# Patient Record
Sex: Female | Born: 1965 | Race: White | Hispanic: No | Marital: Married | Smoking: Never smoker
Health system: Southern US, Community
[De-identification: ages and names within clinical notes are randomized; demographics above are authoritative.]

## PROBLEM LIST (undated history)

## (undated) HISTORY — PX: FOOT SURGERY: SHX648

## (undated) HISTORY — PX: AUGMENTATION MAMMAPLASTY: SUR837

---

## 2006-02-11 DIAGNOSIS — D649 Anemia, unspecified: Secondary | ICD-10-CM | POA: Insufficient documentation

## 2006-02-11 DIAGNOSIS — G43909 Migraine, unspecified, not intractable, without status migrainosus: Secondary | ICD-10-CM | POA: Insufficient documentation

## 2006-07-23 DIAGNOSIS — Z8041 Family history of malignant neoplasm of ovary: Secondary | ICD-10-CM | POA: Insufficient documentation

## 2006-07-23 DIAGNOSIS — Z803 Family history of malignant neoplasm of breast: Secondary | ICD-10-CM | POA: Insufficient documentation

## 2008-05-17 DIAGNOSIS — H5210 Myopia, unspecified eye: Secondary | ICD-10-CM | POA: Insufficient documentation

## 2009-11-25 DIAGNOSIS — G8929 Other chronic pain: Secondary | ICD-10-CM | POA: Insufficient documentation

## 2009-11-28 DIAGNOSIS — S92909A Unspecified fracture of unspecified foot, initial encounter for closed fracture: Secondary | ICD-10-CM | POA: Insufficient documentation

## 2009-11-28 DIAGNOSIS — Z9889 Other specified postprocedural states: Secondary | ICD-10-CM | POA: Insufficient documentation

## 2009-11-28 DIAGNOSIS — J342 Deviated nasal septum: Secondary | ICD-10-CM | POA: Insufficient documentation

## 2009-11-28 DIAGNOSIS — M779 Enthesopathy, unspecified: Secondary | ICD-10-CM | POA: Insufficient documentation

## 2010-03-12 DIAGNOSIS — W5501XA Bitten by cat, initial encounter: Secondary | ICD-10-CM | POA: Insufficient documentation

## 2010-03-12 DIAGNOSIS — L853 Xerosis cutis: Secondary | ICD-10-CM | POA: Insufficient documentation

## 2010-04-25 DIAGNOSIS — F988 Other specified behavioral and emotional disorders with onset usually occurring in childhood and adolescence: Secondary | ICD-10-CM | POA: Insufficient documentation

## 2010-04-25 DIAGNOSIS — Z0000 Encounter for general adult medical examination without abnormal findings: Secondary | ICD-10-CM | POA: Insufficient documentation

## 2010-04-25 DIAGNOSIS — Z8619 Personal history of other infectious and parasitic diseases: Secondary | ICD-10-CM | POA: Insufficient documentation

## 2011-01-06 DIAGNOSIS — L659 Nonscarring hair loss, unspecified: Secondary | ICD-10-CM | POA: Insufficient documentation

## 2014-09-03 DIAGNOSIS — H04123 Dry eye syndrome of bilateral lacrimal glands: Secondary | ICD-10-CM | POA: Insufficient documentation

## 2015-07-05 DIAGNOSIS — Z780 Asymptomatic menopausal state: Secondary | ICD-10-CM | POA: Insufficient documentation

## 2015-07-05 DIAGNOSIS — B349 Viral infection, unspecified: Secondary | ICD-10-CM | POA: Insufficient documentation

## 2015-10-02 DIAGNOSIS — H52223 Regular astigmatism, bilateral: Secondary | ICD-10-CM | POA: Insufficient documentation

## 2015-10-02 DIAGNOSIS — H524 Presbyopia: Secondary | ICD-10-CM | POA: Insufficient documentation

## 2016-02-02 DIAGNOSIS — R0789 Other chest pain: Secondary | ICD-10-CM | POA: Insufficient documentation

## 2016-02-02 DIAGNOSIS — N644 Mastodynia: Secondary | ICD-10-CM | POA: Insufficient documentation

## 2016-07-12 DIAGNOSIS — X030XXA Exposure to flames in controlled fire, not in building or structure, initial encounter: Secondary | ICD-10-CM | POA: Insufficient documentation

## 2017-02-23 ENCOUNTER — Emergency Department (HOSPITAL_COMMUNITY)
Admission: EM | Admit: 2017-02-23 | Discharge: 2017-02-24 | Disposition: A | Discharge status: Home / Self Care | Payer: BLUE CROSS/BLUE SHIELD | Attending: Emergency Medicine | Admitting: Emergency Medicine

## 2017-02-23 ENCOUNTER — Encounter (HOSPITAL_COMMUNITY): Payer: Self-pay

## 2017-02-23 ENCOUNTER — Ambulatory Visit (HOSPITAL_COMMUNITY)
Admission: EM | Admit: 2017-02-23 | Discharge: 2017-02-23 | Disposition: A | Discharge status: Home / Self Care | Payer: BLUE CROSS/BLUE SHIELD | Source: Home / Self Care | Attending: Family Medicine | Admitting: Family Medicine

## 2017-02-23 ENCOUNTER — Encounter (HOSPITAL_COMMUNITY): Payer: Self-pay | Admitting: Emergency Medicine

## 2017-02-23 DIAGNOSIS — J069 Acute upper respiratory infection, unspecified: Secondary | ICD-10-CM

## 2017-02-23 DIAGNOSIS — J0110 Acute frontal sinusitis, unspecified: Secondary | ICD-10-CM

## 2017-02-23 DIAGNOSIS — B9789 Other viral agents as the cause of diseases classified elsewhere: Secondary | ICD-10-CM | POA: Diagnosis not present

## 2017-02-23 DIAGNOSIS — G4489 Other headache syndrome: Secondary | ICD-10-CM | POA: Diagnosis not present

## 2017-02-23 DIAGNOSIS — R51 Headache: Secondary | ICD-10-CM | POA: Diagnosis present

## 2017-02-23 MED ORDER — SODIUM CHLORIDE 0.9 % IV BOLUS (SEPSIS)
1000.0000 mL | Freq: Once | INTRAVENOUS | Status: AC
  Administered 2017-02-24: 1000 mL via INTRAVENOUS

## 2017-02-23 MED ORDER — FLUTICASONE PROPIONATE 50 MCG/ACT NA SUSP: 1.0000 | Freq: Every day | NASAL | 0 refills | Status: DC

## 2017-02-23 MED ORDER — METOCLOPRAMIDE HCL 5 MG/ML IJ SOLN
10.0000 mg | Freq: Once | INTRAMUSCULAR | Status: AC
  Administered 2017-02-24: 10 mg via INTRAVENOUS
  Filled 2017-02-23: qty 2

## 2017-02-23 MED ORDER — DIPHENHYDRAMINE HCL 50 MG/ML IJ SOLN
25.0000 mg | Freq: Once | INTRAMUSCULAR | Status: AC
  Administered 2017-02-24: 25 mg via INTRAVENOUS
  Filled 2017-02-23: qty 1

## 2017-02-23 MED ORDER — PREDNISONE 10 MG PO TABS: 40.0000 mg | ORAL_TABLET | Freq: Every day | ORAL | 0 refills | Status: DC

## 2017-02-23 MED ORDER — KETOROLAC TROMETHAMINE 30 MG/ML IJ SOLN
15.0000 mg | Freq: Once | INTRAMUSCULAR | Status: AC
  Administered 2017-02-24: 15 mg via INTRAVENOUS
  Filled 2017-02-23: qty 1

## 2017-02-23 NOTE — ED Triage Notes (Signed)
Pt seen at u/c today for onset cough, nasal congestion, headache, mild sore throat, and nasal congestion.  Pt was prescribed Flonase and Prednisone.  Pt reported allergic to ? Steroid.  Pt got flonase and Prednisone but has not taken prednisone.  Pt is here tonight for headache across forehead, feels like pressure and pressure behind eyes.  Pt has been taking Mucinex day and nighttime with no relief. No respiratory or swallowing difficulties.

## 2017-02-23 NOTE — ED Provider Notes (Signed)
[facility]    CSN: [id number] Arrival date & time: 02/23/17  1322     History   Chief Complaint Chief Complaint  Patient presents with  . URI    HPI Carol Morrison is a 51 y.o. female.   51 year-old female, presenting today due to cold symptoms. Patient states that she has had nasal congestion, mild sore throat, sinus pressure, headache and mild cough that started yesterday. She has been taking OTC meds at home with much relief. Husband was sick with similar symptoms last week. She denies fever, chills, neck pain or stiffness, nausea, vomiting   The history is provided by the patient.  URI  Presenting symptoms: congestion, cough, ear pain, fatigue, rhinorrhea and sore throat   Presenting symptoms: no fever   Severity:  Moderate Onset quality:  Gradual Duration:  1 day Timing:  Constant Progression:  Unchanged Chronicity:  New Relieved by:  Nothing Worsened by:  Nothing Ineffective treatments:  OTC medications Associated symptoms: headaches, myalgias and sinus pain   Associated symptoms: no arthralgias, no neck pain, no sneezing, no swollen glands and no wheezing   Risk factors: sick contacts   Risk factors: not elderly, no chronic cardiac disease, no chronic kidney disease, no chronic respiratory disease, no diabetes mellitus, no immunosuppression, no recent illness and no recent travel     History reviewed. No pertinent past medical history.  There are no active problems to display for this patient.   Past Surgical History:  Procedure Laterality Date  . FOOT SURGERY      OB History    No data available       Home Medications    Prior to Admission medications   Medication Sig Start Date End Date Taking? Authorizing Provider  fluticasone (FLONASE) 50 MCG/ACT nasal spray Place 1 spray into both nostrils daily. 02/23/17 03/05/17  Blue, Marylene Landlivia C, PA-C    Family History No family history on file.  Social History Social History  Substance  Use Topics  . Smoking status: Not on file  . Smokeless tobacco: Not on file  . Alcohol use Not on file     Allergies   Patient has no known allergies.   Review of Systems Review of Systems  Constitutional: Positive for fatigue. Negative for chills and fever.  HENT: Positive for congestion, ear pain, rhinorrhea, sinus pain and sore throat. Negative for sneezing.   Eyes: Negative for pain and visual disturbance.  Respiratory: Positive for cough. Negative for shortness of breath and wheezing.   Cardiovascular: Negative for chest pain and palpitations.  Gastrointestinal: Negative for abdominal pain and vomiting.  Genitourinary: Negative for dysuria and hematuria.  Musculoskeletal: Positive for myalgias. Negative for arthralgias, back pain and neck pain.  Skin: Negative for color change and rash.  Neurological: Positive for headaches. Negative for seizures and syncope.  All other systems reviewed and are negative.    Physical Exam Triage Vital Signs ED Triage Vitals  Enc Vitals Group     BP 02/23/17 1345 132/74     Pulse Rate 02/23/17 1345 (!) 113     Resp 02/23/17 1345 16     Temp 02/23/17 1345 99.3 F (37.4 C)     Temp Source 02/23/17 1345 Oral     SpO2 02/23/17 1345 100 %     Weight --      Height --      Head Circumference --      Peak Flow --      Pain Score  02/23/17 1346 7     Pain Loc --      Pain Edu? --      Excl. in GC? --    No data found.   Updated Vital Signs BP 132/74 (BP Location: Left Arm)   Pulse (!) 113   Temp 99.3 F (37.4 C) (Oral)   Resp 16   SpO2 100%   Visual Acuity Right Eye Distance:   Left Eye Distance:   Bilateral Distance:    Right Eye Near:   Left Eye Near:    Bilateral Near:     Physical Exam  Constitutional: She appears well-developed and well-nourished. No distress.  HENT:  Head: Normocephalic and atraumatic.  Right Ear: Hearing, tympanic membrane, external ear and ear canal normal.  Left Ear: Hearing, tympanic  membrane, external ear and ear canal normal.  Nose: Nose normal.  Mouth/Throat: Uvula is midline and oropharynx is clear and moist. No oropharyngeal exudate, posterior oropharyngeal edema, posterior oropharyngeal erythema or tonsillar abscesses.  Eyes: Conjunctivae are normal.  Neck: Neck supple. No Brudzinski's sign and no Kernig's sign noted.  Cardiovascular: Normal rate and regular rhythm.   No murmur heard. Pulmonary/Chest: Effort normal and breath sounds normal. No respiratory distress. She has no decreased breath sounds. She has no wheezes. She has no rhonchi. She has no rales.  Abdominal: Soft. There is no tenderness.  Musculoskeletal: She exhibits no edema.  Neurological: She is alert.  Skin: Skin is warm and dry.  Psychiatric: She has a normal mood and affect.  Nursing note and vitals reviewed.    UC Treatments / Results  Labs (all labs ordered are listed, but only abnormal results are displayed) Labs Reviewed - No data to display  EKG  EKG Interpretation None       Radiology No results found.  Procedures Procedures (including critical care time)  Medications Ordered in UC Medications - No data to display   Initial Impression / Assessment and Plan / UC Course  I have reviewed the triage vital signs and the nursing notes.  Pertinent labs & imaging results that were available during my care of the patient were reviewed by me and considered in my medical decision making (see chart for details).     URI symptoms. Patient requesting flu swab. Explained to the patient that we do not flu testing here but she does not seem have influenza symptoms. She is well appearing, afebrile and has mostly URI symptoms. Recommended flonase and prednisone. After leaving the room, patient expressed to nursing that she has had adverse reaction to prednisone in the past. Offered decadron, patient also refused this. Recommended OTC meds   Final Clinical Impressions(s) / UC Diagnoses    Final diagnoses:  Viral URI with cough    New Prescriptions Discharge Medication List as of 02/23/2017  2:06 PM    START taking these medications   Details  fluticasone (FLONASE) 50 MCG/ACT nasal spray Place 1 spray into both nostrils daily., Starting Tue 02/23/2017, Until Fri 03/05/2017, Normal    predniSONE (DELTASONE) 10 MG tablet Take 4 tablets (40 mg total) by mouth daily., Starting Tue 02/23/2017, Until Sun 02/28/2017, Normal         Controlled Substance Prescriptions [place] Controlled Substance Registry consulted? Not Applicable   Alecia Lemming, [place] 02/23/17 1454

## 2017-02-23 NOTE — ED Triage Notes (Signed)
Pt c/o cold symptoms. Head pain, nasal congestion, cough.

## 2017-02-24 NOTE — ED Provider Notes (Signed)
[facility] EMERGENCY DEPARTMENT Provider Note   CSN: [id number] Arrival date & time: 02/23/17  2101     History   Chief Complaint Chief Complaint  Patient presents with  . Headache    HPI Carol Morrison is a 51 y.o. female.  The history is provided by the patient and the spouse.  Headache   This is a new problem. The current episode started yesterday. The problem occurs constantly. The problem has been gradually worsening. The pain is located in the frontal region. The pain is moderate. The pain does not radiate. Associated symptoms include a fever and nausea. Pertinent negatives include no vomiting.  pt reports onset of cough/congestion/sore throat/ facial pain/fever yesterday.  She then developed onset of frontal HA No vomiting No focal weakness She reports HA is worsening and she is unable to sleep Seen at urgent care, given flonase/prednisone but no improvement    PMH -none Soc hx -no travel Past Surgical History:  Procedure Laterality Date  . FOOT SURGERY      OB History    No data available       Home Medications    Prior to Admission medications   Medication Sig Start Date End Date Taking? Authorizing Provider  fluticasone (FLONASE) 50 MCG/ACT nasal spray Place 1 spray into both nostrils daily. 02/23/17 03/05/17  Alecia Lemming, PA-C    Family History History reviewed. No pertinent family history.  Social History Social History  Substance Use Topics  . Smoking status: Never Smoker  . Smokeless tobacco: Never Used  . Alcohol use No     Allergies   Patient has no known allergies.   Review of Systems Review of Systems  Constitutional: Positive for fever.  HENT: Positive for sore throat.   Respiratory: Positive for cough.   Gastrointestinal: Positive for nausea. Negative for vomiting.  Neurological: Positive for headaches. Negative for weakness.  All other systems reviewed and are negative.    Physical Exam Updated  Vital Signs BP 107/74 (BP Location: Right Arm)   Pulse 91   Temp 98.4 F (36.9 C) (Oral)   Resp 16   Ht 1.6 m (5\' 3" )   Wt 47.6 kg (105 lb)   SpO2 99%   BMI 18.60 kg/m   Physical Exam CONSTITUTIONAL: Well developed/well nourished HEAD: Normocephalic/atraumatic, no swelling/edema EYES: EOMI/PERRL, no nystagmus, no ptosis ENMT: Mucous membranes moist, uvula midline, no erythema/exudates, tenderness to maxillary sinuses NECK: supple no meningeal signs, no bruits SPINE/BACK:entire spine nontender CV: S1/S2 noted, no murmurs/rubs/gallops noted LUNGS: Lungs are clear to auscultation bilaterally, no apparent distress ABDOMEN: soft, nontender, no rebound or guarding GU:no cva tenderness NEURO:Awake/alert, face symmetric, no arm or leg drift is noted Equal 5/5 strength with shoulder abduction, elbow flex/extension, wrist flex/extension in upper extremities and equal hand grips bilaterally Equal 5/5 strength with hip flexion,knee flex/extension, foot dorsi/plantar flexion Cranial nerves 3/4/5/6/11/02/08/11/12 tested and intact Gait normal without ataxia No past pointing Sensation to light touch intact in all extremities EXTREMITIES: pulses normal, full ROM SKIN: warm, color normal PSYCH: no abnormalities of mood noted, alert and oriented to situation    ED Treatments / Results  Labs (all labs ordered are listed, but only abnormal results are displayed) Labs Reviewed - No data to display  EKG  EKG Interpretation None       Radiology No results found.  Procedures Procedures (including critical care time)  Medications Ordered in ED Medications  metoCLOPramide (REGLAN) injection 10 mg (10 mg Intravenous Given 02/24/17  0002)  diphenhydrAMINE (BENADRYL) injection 25 mg (25 mg Intravenous Given 02/24/17 0002)  ketorolac (TORADOL) 30 MG/ML injection 15 mg (15 mg Intravenous Given 02/24/17 0002)  sodium chloride 0.9 % bolus 1,000 mL (1,000 mLs Intravenous New Bag/Given 02/24/17  0002)     Initial Impression / Assessment and Plan / ED Course  I have reviewed the triage vital signs and the nursing notes.   Probable viral sinusitis that has triggered frontal HA Pt improved No signs of meningitis or other acute neuro emergency D/c home   Final Clinical Impressions(s) / ED Diagnoses   Final diagnoses:  Acute non-recurrent frontal sinusitis  Other headache syndrome    New Prescriptions New Prescriptions   No medications on file     Zadie RhineWickline, Effie Janoski, MD 02/24/17 [phone]

## 2017-02-24 NOTE — ED Notes (Signed)
Patient left at this time with all belongings. 

## 2017-12-04 ENCOUNTER — Ambulatory Visit (INDEPENDENT_AMBULATORY_CARE_PROVIDER_SITE_OTHER): Payer: BLUE CROSS/BLUE SHIELD

## 2017-12-04 ENCOUNTER — Ambulatory Visit (INDEPENDENT_AMBULATORY_CARE_PROVIDER_SITE_OTHER): Payer: BLUE CROSS/BLUE SHIELD | Admitting: Podiatry

## 2017-12-04 ENCOUNTER — Other Ambulatory Visit: Payer: Self-pay | Admitting: Podiatry

## 2017-12-04 DIAGNOSIS — M216X9 Other acquired deformities of unspecified foot: Secondary | ICD-10-CM | POA: Diagnosis not present

## 2017-12-04 DIAGNOSIS — M778 Other enthesopathies, not elsewhere classified: Secondary | ICD-10-CM

## 2017-12-04 DIAGNOSIS — M722 Plantar fascial fibromatosis: Secondary | ICD-10-CM

## 2017-12-04 DIAGNOSIS — M779 Enthesopathy, unspecified: Secondary | ICD-10-CM

## 2017-12-07 NOTE — Progress Notes (Signed)
   Subjective: 52 year old female presenting today as a new patient with a chief complaint of bilateral foot pain that began four years ago. She states the pain is located in the arches and heels and began after a 5 year walking event. She states the pain started worsening six months ago. She has not done anything for treatment. Walking and standing for long periods of time increases the pain. Patient is here for further evaluation and treatment.   No past medical history on file.   Objective: Physical Exam General: The patient is alert and oriented x3 in no acute distress.  Dermatology: Skin is warm, dry and supple bilateral lower extremities. Negative for open lesions or macerations bilateral.   Vascular: Dorsalis Pedis and Posterior Tibial pulses palpable bilateral.  Capillary fill time is immediate to all digits.  Neurological: Epicritic and protective threshold intact bilateral.   Musculoskeletal: Tenderness to palpation to the plantar aspect of the bilateral heels along the plantar fascia. All other joints range of motion within normal limits bilateral. Strength 5/5 in all groups bilateral.   Radiographic exam: Increased calcaneus inclination angle with increased metatarsal declination angle consistent with cavus foot type.   Assessment: 1. plantar fasciitis bilateral feet 2. Generalized foot pain bilateral 3. Cavus foot type bilateral   Plan of Care:  1. Patient evaluated. Xrays reviewed.   2. Injection of 0.5cc Celestone soluspan injected into the bilateral heels.  3. Declined oral NSAIDs.  4. Plantar fascial braces dispensed bilaterally.  5. Appointment with Raiford Nobleick for custom molded orthotics.  6. Return to clinic as needed.    Felecia ShellingBrent M. Dontay Harm, DPM [facility]  Dr. Felecia ShellingBrent M. Herta Hink, DPM    [address]                                   [place], [place] [address]                Office [phone]  Fax [phone]

## 2017-12-09 ENCOUNTER — Ambulatory Visit (INDEPENDENT_AMBULATORY_CARE_PROVIDER_SITE_OTHER): Payer: BLUE CROSS/BLUE SHIELD | Admitting: Orthotics

## 2017-12-09 ENCOUNTER — Other Ambulatory Visit: Payer: BLUE CROSS/BLUE SHIELD | Admitting: Orthotics

## 2017-12-09 DIAGNOSIS — M778 Other enthesopathies, not elsewhere classified: Secondary | ICD-10-CM

## 2017-12-09 DIAGNOSIS — M779 Enthesopathy, unspecified: Secondary | ICD-10-CM

## 2017-12-09 DIAGNOSIS — M216X9 Other acquired deformities of unspecified foot: Secondary | ICD-10-CM

## 2017-12-09 DIAGNOSIS — M722 Plantar fascial fibromatosis: Secondary | ICD-10-CM | POA: Diagnosis not present

## 2017-12-09 NOTE — Progress Notes (Signed)
Patient came into today for casting bilateral f/o to address plantar fasciitis.   Patient foot type is pes cavus. Patient reports history of foot pain involving plantar aponeurosis.  Goal is to provide longitudinal arch support and correct any RF instability due to heel eversion/inversion.  Ultimate goal is to relieve tension at pf insertion calcaneal tuberosity.  Plan on semi-rigid device addressing heel stability and relieving PF tension.     Patient also wants a dress f/o...both were ordered today.

## 2017-12-20 ENCOUNTER — Other Ambulatory Visit: Payer: BLUE CROSS/BLUE SHIELD | Admitting: Orthotics

## 2017-12-30 ENCOUNTER — Ambulatory Visit (INDEPENDENT_AMBULATORY_CARE_PROVIDER_SITE_OTHER): Payer: BLUE CROSS/BLUE SHIELD | Admitting: Orthotics

## 2017-12-30 DIAGNOSIS — M722 Plantar fascial fibromatosis: Secondary | ICD-10-CM

## 2017-12-30 DIAGNOSIS — M216X9 Other acquired deformities of unspecified foot: Secondary | ICD-10-CM

## 2017-12-30 DIAGNOSIS — M779 Enthesopathy, unspecified: Secondary | ICD-10-CM

## 2017-12-30 DIAGNOSIS — M778 Other enthesopathies, not elsewhere classified: Secondary | ICD-10-CM

## 2017-12-30 NOTE — Progress Notes (Signed)
Patient came in today to pick up custom made foot orthotics (2 pair).  The goals were accomplished and the patient reported no dissatisfaction with said orthotics.  Patient was advised of breakin period and how to report any issues.

## 2019-02-20 ENCOUNTER — Other Ambulatory Visit: Payer: Self-pay

## 2019-02-20 ENCOUNTER — Ambulatory Visit (INDEPENDENT_AMBULATORY_CARE_PROVIDER_SITE_OTHER): Payer: BC Managed Care – PPO | Admitting: Podiatry

## 2019-02-20 DIAGNOSIS — M216X9 Other acquired deformities of unspecified foot: Secondary | ICD-10-CM | POA: Diagnosis not present

## 2019-02-20 DIAGNOSIS — M778 Other enthesopathies, not elsewhere classified: Secondary | ICD-10-CM | POA: Diagnosis not present

## 2019-02-20 DIAGNOSIS — M722 Plantar fascial fibromatosis: Secondary | ICD-10-CM | POA: Diagnosis not present

## 2019-02-22 ENCOUNTER — Other Ambulatory Visit: Payer: Self-pay | Admitting: Family Medicine

## 2019-02-22 DIAGNOSIS — R928 Other abnormal and inconclusive findings on diagnostic imaging of breast: Secondary | ICD-10-CM

## 2019-02-22 NOTE — Progress Notes (Signed)
   Subjective: 53 year old female presenting today for follow up evaluation of bilateral foot pain. She reports continued pain. She has been using the fascial braces, custom orthotics and states the injections have all helped to ease her symptoms. She is interested in other treatment options at this time. Patient is here for further evaluation and treatment.   No past medical history on file.   Objective: Physical Exam General: The patient is alert and oriented x3 in no acute distress.  Dermatology: Skin is warm, dry and supple bilateral lower extremities. Negative for open lesions or macerations bilateral.   Vascular: Dorsalis Pedis and Posterior Tibial pulses palpable bilateral.  Capillary fill time is immediate to all digits.  Neurological: Epicritic and protective threshold intact bilateral.   Musculoskeletal: Tenderness to palpation to the plantar aspect of the bilateral heels along the plantar fascia. All other joints range of motion within normal limits bilateral. Strength 5/5 in all groups bilateral.    Assessment: 1. plantar fasciitis bilateral feet - chronic  2. Generalized foot pain bilateral 3. Cavus foot type bilateral   Plan of Care:  1. Patient evaluated.  2. Night splint dispensed.  3. Appointment with Liliane Channel, Pedorthist, for custom molded orthotics.  4. Continue using custom orthotics she already has.  5. Recommended EPF surgery bilaterally. Patient wants to think about it.  6. Return to clinic as needed when ready for surgical consult.     Edrick Kins, DPM [facility]  Dr. Edrick Kins, DPM    [address] [place], [place] [address]                Office [phone]  Fax [phone]

## 2019-08-05 ENCOUNTER — Ambulatory Visit: Payer: BLUE CROSS/BLUE SHIELD

## 2019-08-23 ENCOUNTER — Telehealth: Payer: Self-pay | Admitting: Podiatry

## 2019-08-23 NOTE — Telephone Encounter (Signed)
Pt left message stating she needed and appt.  I returned call and left message for pt to call back to schedule an appt.

## 2019-09-04 ENCOUNTER — Ambulatory Visit (INDEPENDENT_AMBULATORY_CARE_PROVIDER_SITE_OTHER): Payer: BC Managed Care – PPO | Admitting: Orthotics

## 2019-09-04 ENCOUNTER — Other Ambulatory Visit: Payer: Self-pay

## 2019-09-04 DIAGNOSIS — M722 Plantar fascial fibromatosis: Secondary | ICD-10-CM | POA: Diagnosis not present

## 2019-09-04 NOTE — Progress Notes (Signed)
Cut sulcus f/o down to make more narrow to fit n dress shoes; also did repeat 2019 pair.

## 2019-09-15 ENCOUNTER — Other Ambulatory Visit: Payer: Self-pay | Admitting: Obstetrics and Gynecology

## 2019-09-15 DIAGNOSIS — N6314 Unspecified lump in the right breast, lower inner quadrant: Secondary | ICD-10-CM

## 2019-09-22 ENCOUNTER — Ambulatory Visit
Admission: RE | Admit: 2019-09-22 | Discharge: 2019-09-22 | Disposition: A | Discharge status: Home / Self Care | Payer: BC Managed Care – PPO | Source: Ambulatory Visit | Attending: Obstetrics and Gynecology | Admitting: Obstetrics and Gynecology

## 2019-09-22 ENCOUNTER — Other Ambulatory Visit: Payer: Self-pay

## 2019-09-22 DIAGNOSIS — N6314 Unspecified lump in the right breast, lower inner quadrant: Secondary | ICD-10-CM

## 2019-11-19 ENCOUNTER — Encounter (HOSPITAL_COMMUNITY): Payer: Self-pay | Admitting: Emergency Medicine

## 2019-11-19 ENCOUNTER — Other Ambulatory Visit: Payer: Self-pay

## 2019-11-19 ENCOUNTER — Ambulatory Visit (HOSPITAL_COMMUNITY)
Admission: EM | Admit: 2019-11-19 | Discharge: 2019-11-19 | Disposition: A | Discharge status: Home / Self Care | Payer: BC Managed Care – PPO

## 2019-11-19 DIAGNOSIS — R2231 Localized swelling, mass and lump, right upper limb: Secondary | ICD-10-CM

## 2019-11-19 DIAGNOSIS — M79601 Pain in right arm: Secondary | ICD-10-CM | POA: Diagnosis not present

## 2019-11-19 NOTE — Discharge Instructions (Addendum)
I do think you could try ice and/or antiinflammatory to try to help with discomfort related to the region of swelling/ mass to your right inner arm.  I think that this likely a cyst vs lipoma.  I feel that an ultrasound would likely be able to more definitely determine  what this is.  I would recommend following up with your primary care provider or sports medicine for evaluation of this.  If worsening- becoming red, more tender, more numbness or tingling, fevers, or otherwise worsening please return.

## 2019-11-19 NOTE — ED Triage Notes (Signed)
Patient has intermittent hand numbness or right shoulder ache for 2 months or more.  Patient has neck issues as well.    Over the last 4 days patient has noticed a "tight band" sensation to right arm, just above elbow.  Patient says she has talked to pcp.  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    Patient does quote mri reports when asked what is her concern.   Patient reports a knot and swelling to right arm.   Patient does have an area of swelling, firmness just below right elbow, radial aspect
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
## 2020-03-19 ENCOUNTER — Other Ambulatory Visit: Payer: Self-pay | Admitting: Internal Medicine

## 2020-03-19 DIAGNOSIS — K7689 Other specified diseases of liver: Secondary | ICD-10-CM

## 2020-03-28 ENCOUNTER — Ambulatory Visit (HOSPITAL_COMMUNITY)
Admission: RE | Admit: 2020-03-28 | Discharge: 2020-03-28 | Disposition: A | Discharge status: Home / Self Care | Payer: BC Managed Care – PPO | Source: Ambulatory Visit | Attending: Internal Medicine | Admitting: Internal Medicine

## 2020-03-28 ENCOUNTER — Encounter (INDEPENDENT_AMBULATORY_CARE_PROVIDER_SITE_OTHER): Payer: Self-pay

## 2020-03-28 ENCOUNTER — Other Ambulatory Visit: Payer: Self-pay

## 2020-03-28 DIAGNOSIS — K7689 Other specified diseases of liver: Secondary | ICD-10-CM | POA: Insufficient documentation

## 2020-03-28 MED ORDER — GADOBUTROL 1 MMOL/ML IV SOLN
5.0000 mL | Freq: Once | INTRAVENOUS | Status: AC | PRN
  Administered 2020-03-28: 5 mL via INTRAVENOUS

## 2021-02-07 ENCOUNTER — Other Ambulatory Visit: Payer: Self-pay | Admitting: Internal Medicine

## 2021-02-07 DIAGNOSIS — R42 Dizziness and giddiness: Secondary | ICD-10-CM

## 2021-02-07 DIAGNOSIS — R519 Headache, unspecified: Secondary | ICD-10-CM

## 2021-02-12 ENCOUNTER — Other Ambulatory Visit (HOSPITAL_COMMUNITY): Payer: Self-pay | Admitting: Internal Medicine

## 2021-02-12 DIAGNOSIS — R42 Dizziness and giddiness: Secondary | ICD-10-CM

## 2021-02-12 DIAGNOSIS — R519 Headache, unspecified: Secondary | ICD-10-CM

## 2021-02-20 ENCOUNTER — Ambulatory Visit (HOSPITAL_COMMUNITY): Payer: BC Managed Care – PPO

## 2021-02-20 ENCOUNTER — Encounter (HOSPITAL_COMMUNITY): Payer: Self-pay

## 2021-02-21 ENCOUNTER — Ambulatory Visit: Payer: BC Managed Care – PPO

## 2021-02-26 ENCOUNTER — Other Ambulatory Visit: Payer: Self-pay | Admitting: Family Medicine

## 2021-02-26 DIAGNOSIS — R1310 Dysphagia, unspecified: Secondary | ICD-10-CM

## 2021-02-26 DIAGNOSIS — R490 Dysphonia: Secondary | ICD-10-CM

## 2021-02-27 ENCOUNTER — Other Ambulatory Visit (HOSPITAL_COMMUNITY): Payer: Self-pay | Admitting: Family Medicine

## 2021-02-27 DIAGNOSIS — R490 Dysphonia: Secondary | ICD-10-CM

## 2021-02-27 DIAGNOSIS — R1310 Dysphagia, unspecified: Secondary | ICD-10-CM

## 2021-03-03 ENCOUNTER — Other Ambulatory Visit: Payer: Self-pay

## 2021-03-03 ENCOUNTER — Ambulatory Visit (HOSPITAL_COMMUNITY)
Admission: RE | Admit: 2021-03-03 | Discharge: 2021-03-03 | Disposition: A | Discharge status: Home / Self Care | Payer: BC Managed Care – PPO | Source: Ambulatory Visit | Attending: Family Medicine | Admitting: Family Medicine

## 2021-03-03 DIAGNOSIS — R490 Dysphonia: Secondary | ICD-10-CM | POA: Insufficient documentation

## 2021-03-03 DIAGNOSIS — R1310 Dysphagia, unspecified: Secondary | ICD-10-CM | POA: Insufficient documentation

## 2021-03-11 ENCOUNTER — Emergency Department (HOSPITAL_COMMUNITY)
Admission: EM | Admit: 2021-03-11 | Discharge: 2021-03-12 | Disposition: A | Discharge status: Home / Self Care | Payer: BC Managed Care – PPO | Attending: Emergency Medicine | Admitting: Emergency Medicine

## 2021-03-11 ENCOUNTER — Encounter (HOSPITAL_COMMUNITY): Payer: Self-pay

## 2021-03-11 ENCOUNTER — Other Ambulatory Visit: Payer: Self-pay

## 2021-03-11 DIAGNOSIS — K219 Gastro-esophageal reflux disease without esophagitis: Secondary | ICD-10-CM

## 2021-03-11 DIAGNOSIS — J029 Acute pharyngitis, unspecified: Secondary | ICD-10-CM | POA: Diagnosis not present

## 2021-03-11 DIAGNOSIS — Z20822 Contact with and (suspected) exposure to covid-19: Secondary | ICD-10-CM | POA: Diagnosis not present

## 2021-03-11 DIAGNOSIS — R1310 Dysphagia, unspecified: Secondary | ICD-10-CM | POA: Diagnosis present

## 2021-03-11 DIAGNOSIS — R079 Chest pain, unspecified: Secondary | ICD-10-CM | POA: Insufficient documentation

## 2021-03-11 LAB — I-STAT CHEM 8, ED
BUN: 12 mg/dL (ref 6–20)
Calcium, Ion: 1.23 mmol/L (ref 1.15–1.40)
Chloride: 102 mmol/L (ref 98–111)
Creatinine, Ser: 0.5 mg/dL (ref 0.44–1.00)
Glucose, Bld: 109 mg/dL — ABNORMAL HIGH (ref 70–99)
HCT: 35 % — ABNORMAL LOW (ref 36.0–46.0)
Hemoglobin: 11.9 g/dL — ABNORMAL LOW (ref 12.0–15.0)
Potassium: 4.3 mmol/L (ref 3.5–5.1)
Sodium: 138 mmol/L (ref 135–145)
TCO2: 26 mmol/L (ref 22–32)

## 2021-03-11 LAB — I-STAT BETA HCG BLOOD, ED (MC, WL, AP ONLY): I-stat hCG, quantitative: <5 m[IU]/mL (ref <5)

## 2021-03-11 MED ORDER — ALUM & MAG HYDROXIDE-SIMETH 200-200-20 MG/5ML PO SUSP
30.0000 mL | Freq: Once | ORAL | Status: AC
  Administered 2021-03-12: 30 mL via ORAL
  Filled 2021-03-11: qty 30

## 2021-03-11 MED ORDER — LIDOCAINE VISCOUS HCL 2 % MT SOLN
15.0000 mL | Freq: Once | OROMUCOSAL | Status: AC
  Administered 2021-03-12: 15 mL via ORAL
  Filled 2021-03-11: qty 15

## 2021-03-11 NOTE — ED Triage Notes (Signed)
Pt reports with sore throat and chest pain x 2 weeks. Pt states that today her throat has been burning more.
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
## 2021-03-11 NOTE — ED Provider Notes (Signed)
Emergency Medicine Provider Triage Evaluation Note 10:33 PM  Carol Morrison , a 55 y.o. female  was evaluated in triage.  Pt complains of throat pain and masses sensation.  Sensation has been present for about 6 days.  She has completed a course of steroids, amoxicillin, omeprazole with no relief.  Sensation is sore severe for as though there is something in her mid throat difficulty with swallowing, speaking or both present.  Review of Systems  Positive: As above Negative: No fever, no vomiting, no lower chest pain  Physical Exam  BP 114/85 (BP Location: Right Arm)   Pulse 81   Temp 98.1 F (36.7 C) (Oral)   Resp 17   Ht 5\' 3"  (1.6 m)   Wt 54.4 kg   SpO2 100%   BMI 21.26 kg/m  Gen:   Awake, no distress   Resp:  Normal effort  MSK:   Moves extremities without difficulty no deformities Other:  ENT: No asymmetry, no exudate, no substantial edema in area of oropharynx visible.  Tender to palpation with pressure application submandibular region  Medical Decision Making  Medically screening exam initiated at 10:33 PM.  Appropriate orders placed.  Carol Morrison was informed that the remainder of the evaluation will be completed by another provider, this initial triage assessment does not replace that evaluation, and the importance of remaining in the ED until their evaluation is complete.   Corrie Mckusick, MD 03/11/21 2234

## 2021-03-12 ENCOUNTER — Emergency Department (HOSPITAL_COMMUNITY): Payer: BC Managed Care – PPO

## 2021-03-12 LAB — GROUP A STREP BY PCR: Group A Strep by PCR: NOT DETECTED

## 2021-03-12 LAB — HEPATIC FUNCTION PANEL
ALT: 12 U/L (ref 0–44)
AST: 17 U/L (ref 15–41)
Albumin: 4.5 g/dL (ref 3.5–5.0)
Alkaline Phosphatase: 71 U/L (ref 38–126)
Bilirubin, Direct: 0.1 mg/dL (ref 0.0–0.2)
Indirect Bilirubin: 0.7 mg/dL (ref 0.3–0.9)
Total Bilirubin: 0.8 mg/dL (ref 0.3–1.2)
Total Protein: 7.6 g/dL (ref 6.5–8.1)

## 2021-03-12 LAB — TROPONIN I (HIGH SENSITIVITY): Troponin I (High Sensitivity): <2 ng/L (ref <18)

## 2021-03-12 LAB — RESP PANEL BY RT-PCR (FLU A&B, COVID) ARPGX2
Influenza A by PCR: NEGATIVE
Influenza B by PCR: NEGATIVE
SARS Coronavirus 2 by RT PCR: NEGATIVE

## 2021-03-12 LAB — LIPASE, BLOOD: Lipase: 32 U/L (ref 11–51)

## 2021-03-12 MED ORDER — ALUMINUM-MAGNESIUM-SIMETHICONE 200-200-20 MG/5ML PO SUSP: 30.0000 mL | Freq: Three times a day (TID) | ORAL | 0 refills | Status: DC

## 2021-03-12 MED ORDER — OMEPRAZOLE 20 MG PO CPDR: 20.0000 mg | DELAYED_RELEASE_CAPSULE | Freq: Every day | ORAL | 0 refills | Status: DC

## 2021-03-12 MED ORDER — SUCRALFATE 1 G PO TABS: 1.0000 g | ORAL_TABLET | Freq: Three times a day (TID) | ORAL | 0 refills | Status: DC

## 2021-03-12 MED ORDER — IOHEXOL 350 MG/ML SOLN
60.0000 mL | Freq: Once | INTRAVENOUS | Status: AC | PRN
  Administered 2021-03-12: 60 mL via INTRAVENOUS

## 2021-03-12 NOTE — Discharge Instructions (Signed)
Your work-up today is reassuring.  Take Prilosec as prescribed.  Avoid alcohol, caffeine, NSAID medications, spicy foods.  Follow-up with your primary doctor as well as ENT as scheduled. Return to the ED with any worsening symptoms.

## 2021-12-01 IMAGING — US US THYROID
1 series · 14 of 25 positions shown · non-contrast
Comparison: None.

CLINICAL DATA: Other.  55-year-old female with hoarseness.

EXAM:
THYROID ULTRASOUND
TECHNIQUE: Ultrasound examination of the thyroid gland and adjacent soft
tissues was performed.

[Series 1: us thyroid · 55 acquisitions, 14 frames shown]
[im 1/55]
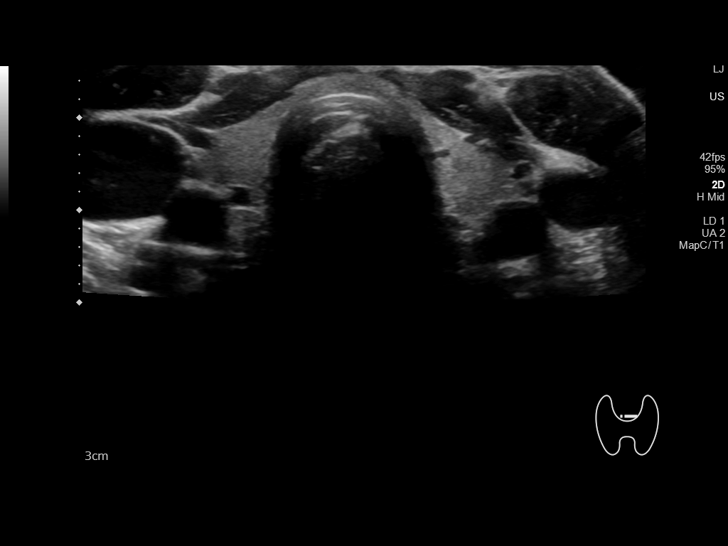
[im 5/55]
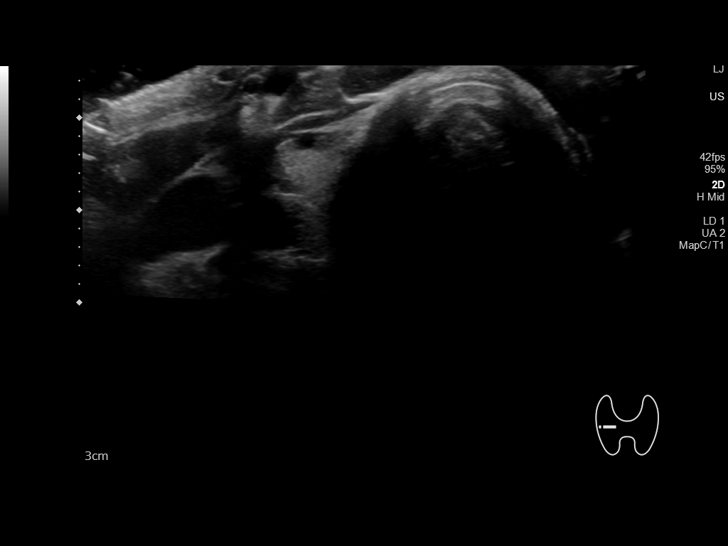
[im 10/55]
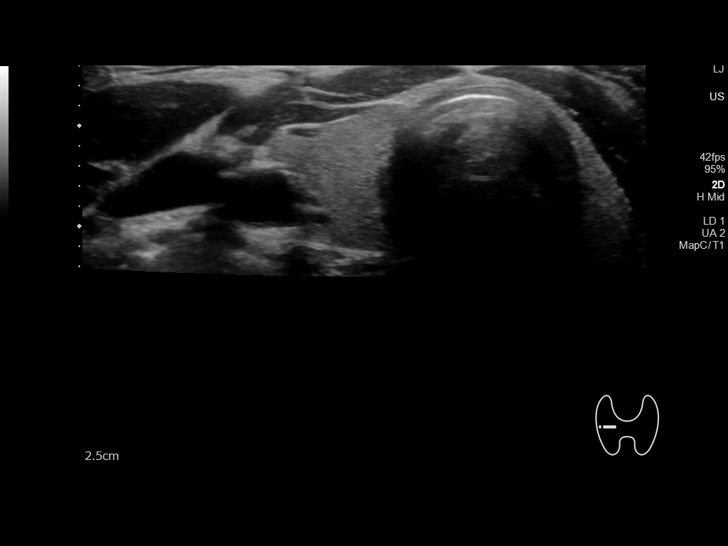
[im 14/55]
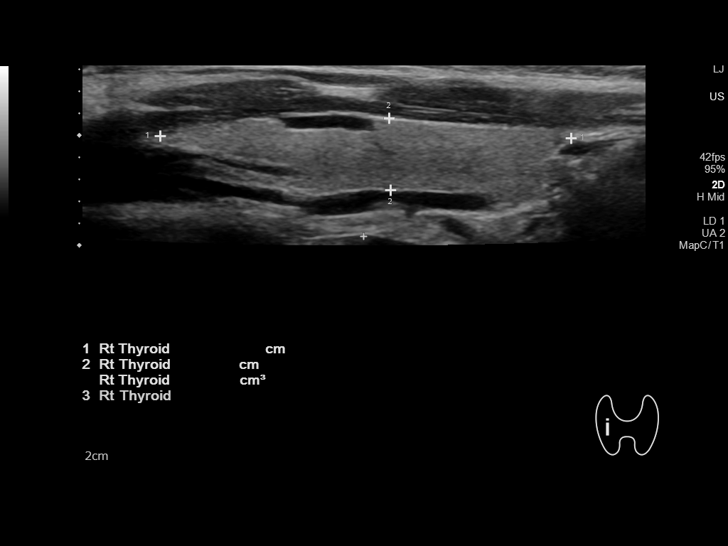
[im 19/55]
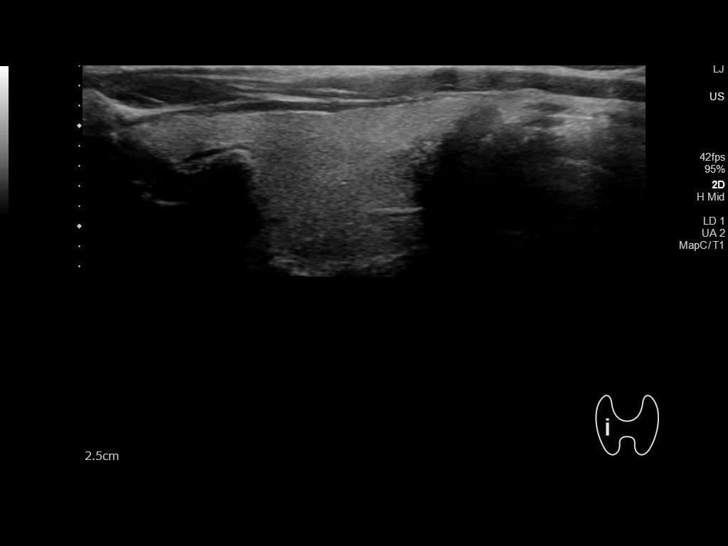
[im 21/55]
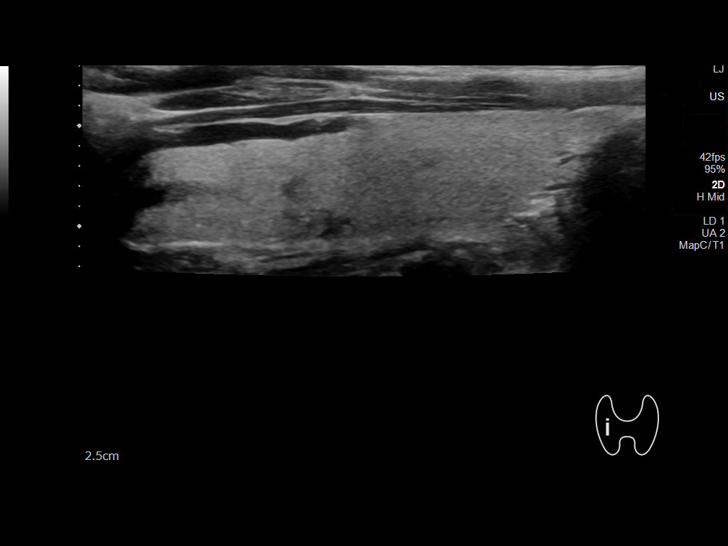
[im 25/55]
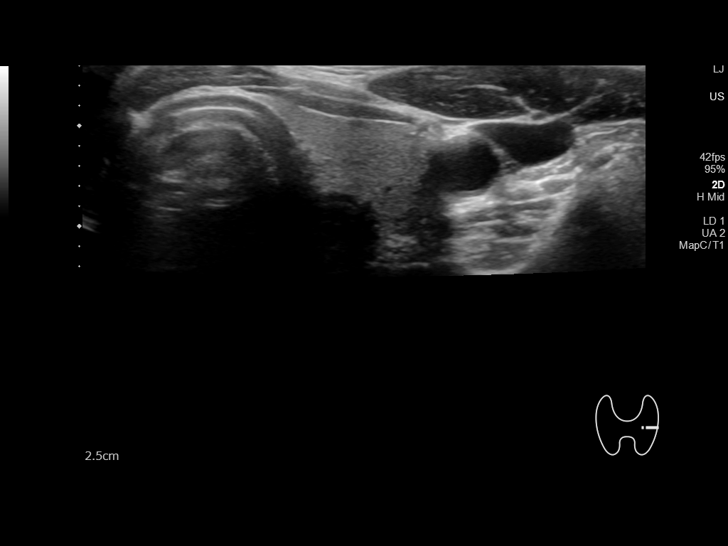
[im 30/55]
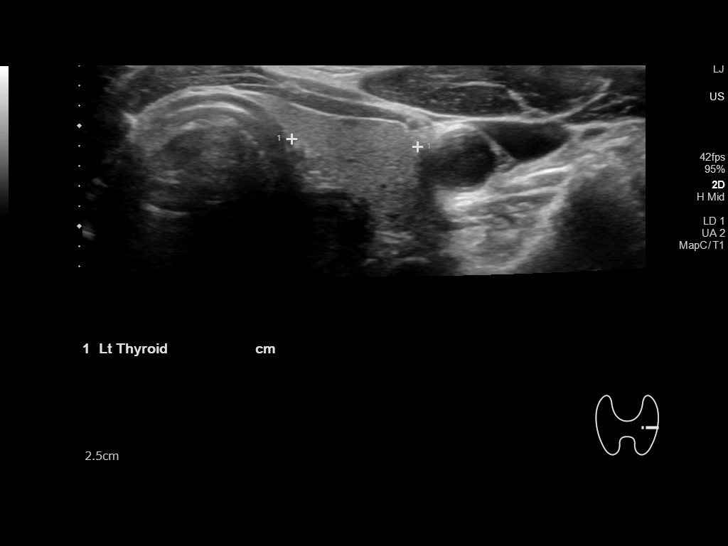
[im 34/55]
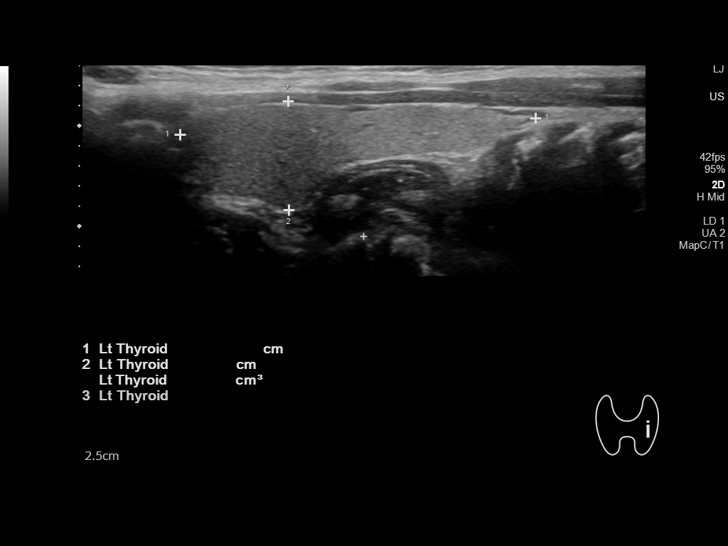
[im 37/55]
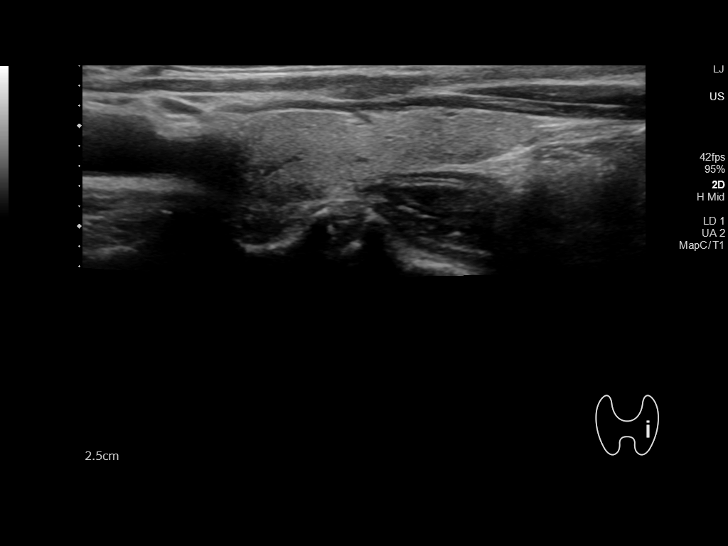
[im 41/55]
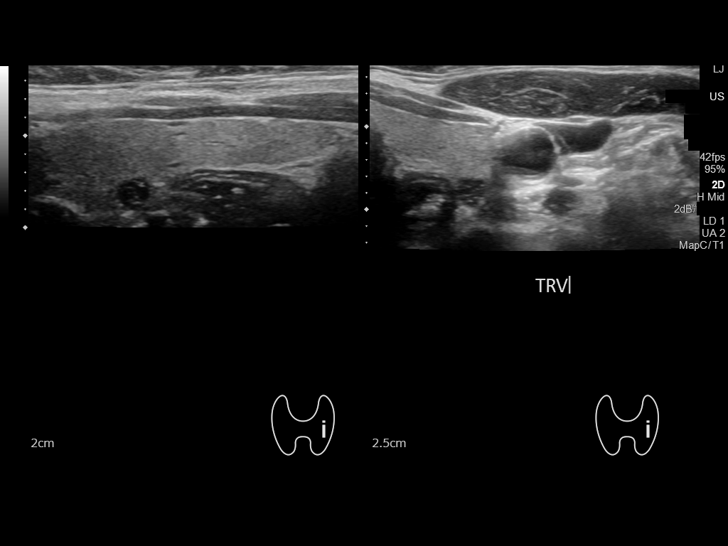
[im 46/55]
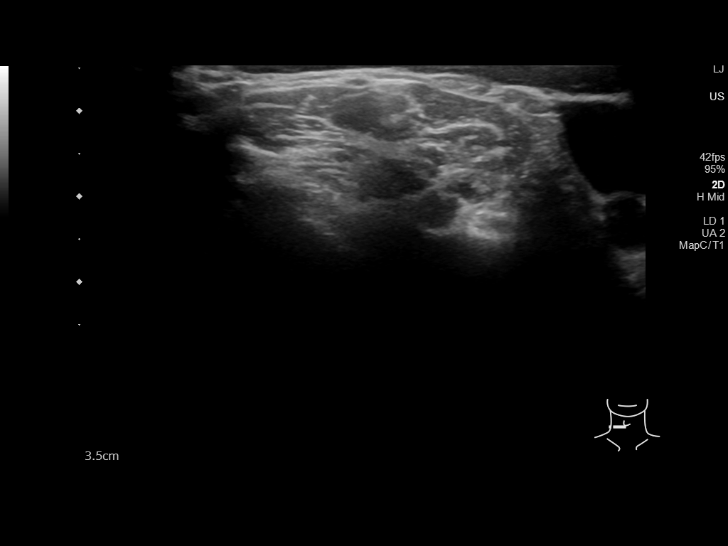
[im 50/55]
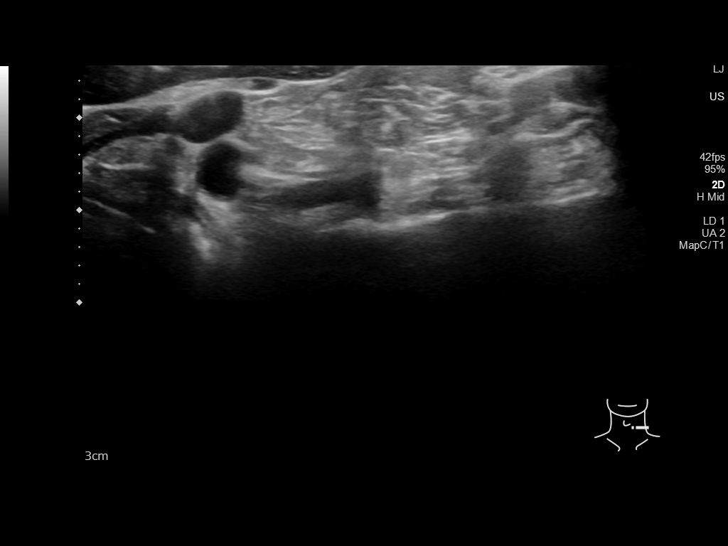
[im 55/55]
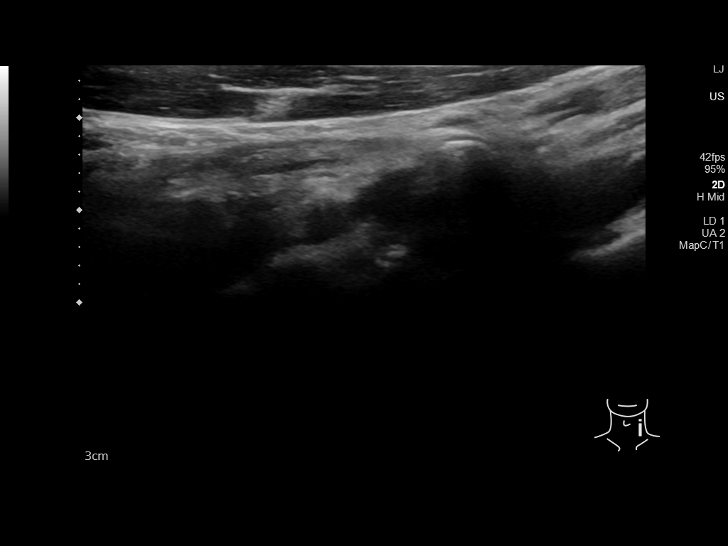

[14 of 25 positions shown; findings below may reference images not displayed]

FINDINGS: Parenchymal Echotexture: Normal

Isthmus: 0.2 cm

Right lobe: 3.7 x 0.7 x 1.2 cm

Left lobe: 3.5 x 1.1 x 1.3 cm

_________________________________________________________

Estimated total number of nodules >/= 1 cm: 0

Number of spongiform nodules >/=  2 cm not described below (TR1): 0

Number of mixed cystic and solid nodules >/= 1.5 cm not described
below (TR2): 0

_________________________________________________________

No discrete nodules are seen within the thyroid gland. No cervical
lymphadenopathy.
IMPRESSION: Normal sonographic appearance of the thyroid gland.

## 2021-12-10 IMAGING — CT CT NECK W/ CM
3 of 4 series · 13 of 33 positions shown, 16 images · IV contrast (omnipaque)
Comparison: None.

CLINICAL DATA: Masslike sensation of the deep neck.  Sore throat.

EXAM:
CT NECK WITH CONTRAST
TECHNIQUE: Multidetector CT imaging of the neck was performed using the
standard protocol following the bolus administration of intravenous
contrast.
CONTRAST:  60mL OMNIPAQUE IOHEXOL 350 MG/ML SOLN

[Series 4: axial · axial · 0.49mm/px · z∈[-293,-130]mm · 5 of 126 slices shown, 7 images]
[im 18/126  soft-tissue]
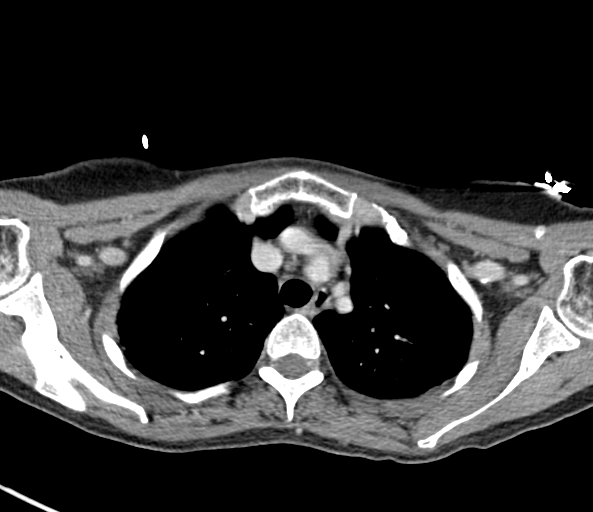
[im 18/126  bone]
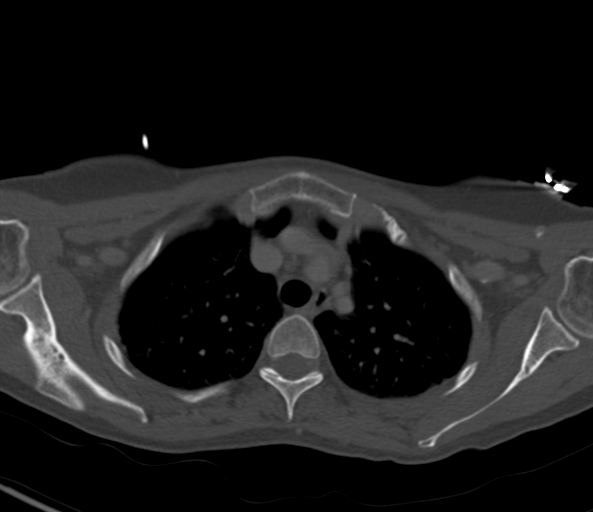
[im 36/126  bone]
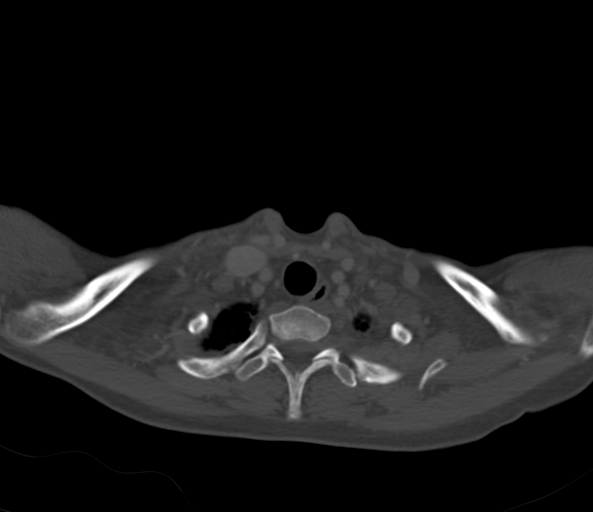
[im 72/126  bone]
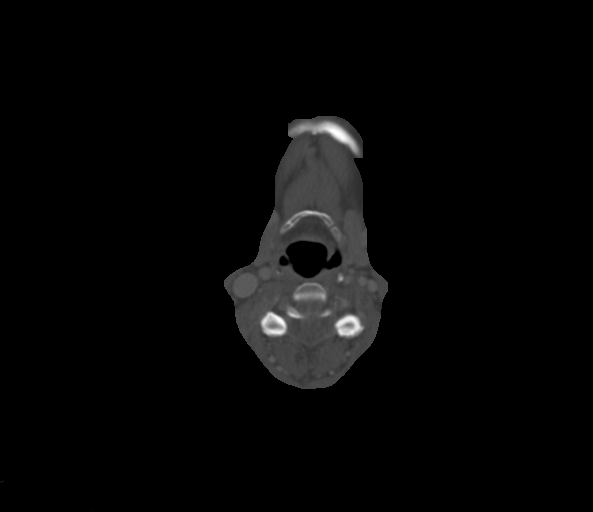
[im 90/126  bone]
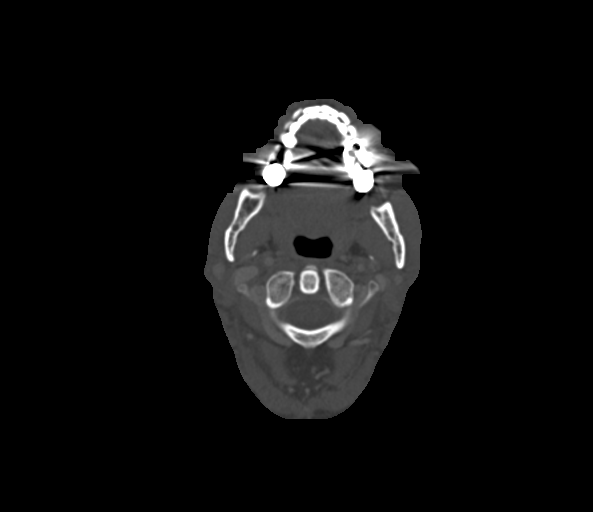
[im 108/126  soft-tissue]
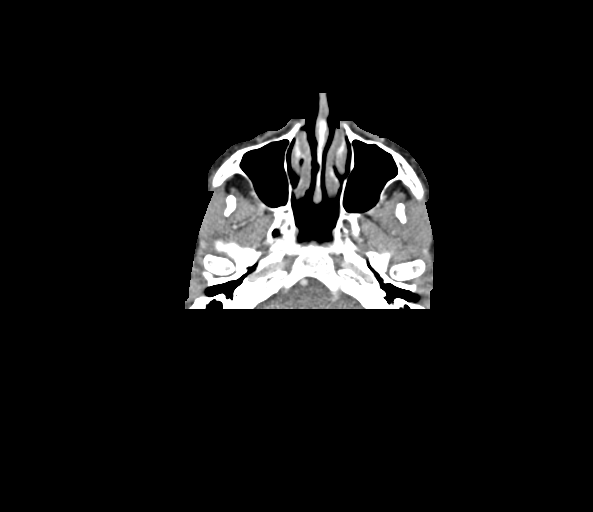
[im 108/126  bone]
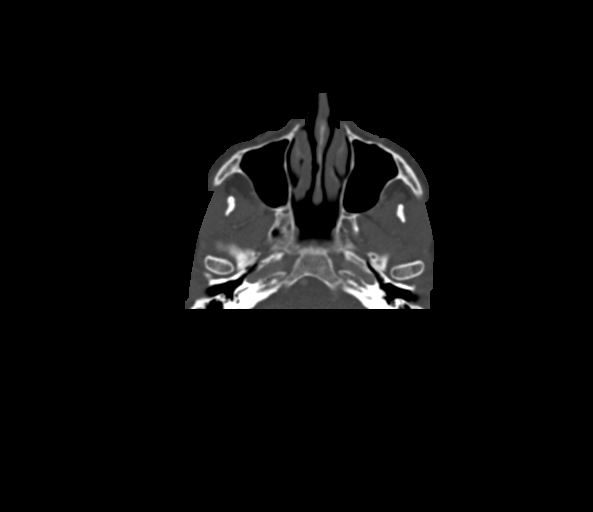

[Series 5: coronal · coronal · 0.51mm/px · 3 of 76 slices shown]
[im 26/76  bone]
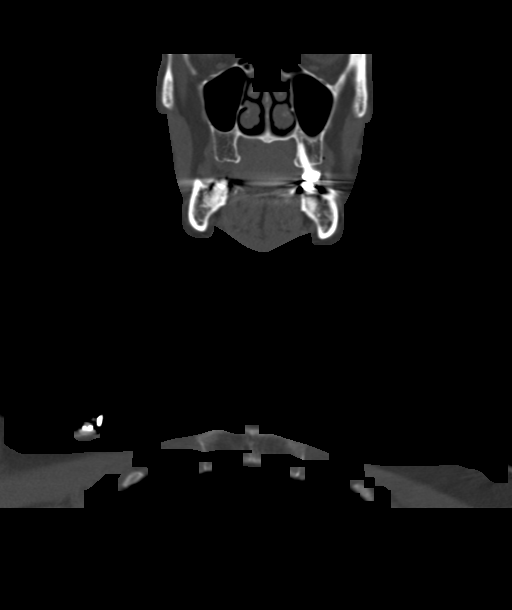
[im 34/76  bone]
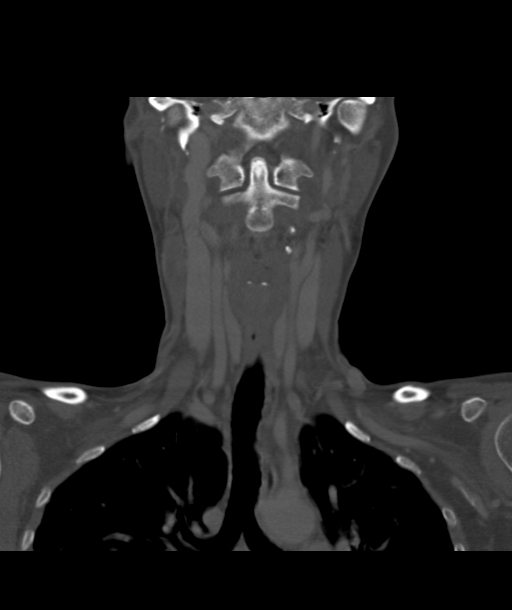
[im 42/76  bone]
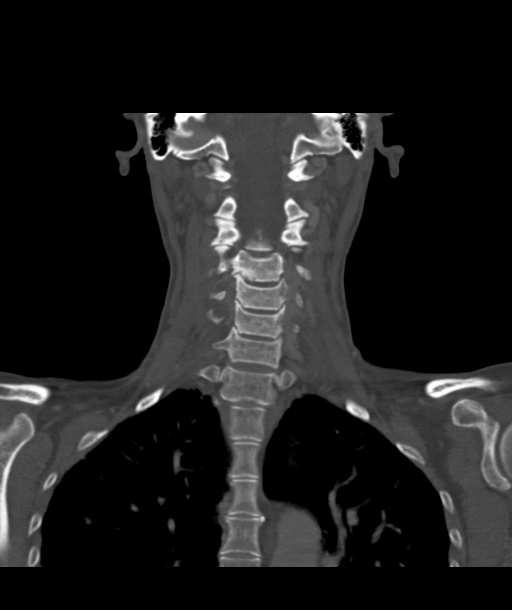

[Series 6: sagittal · sagittal · 0.47mm/px · 5 of 77 slices shown, 6 images]
[im 26/77  bone]
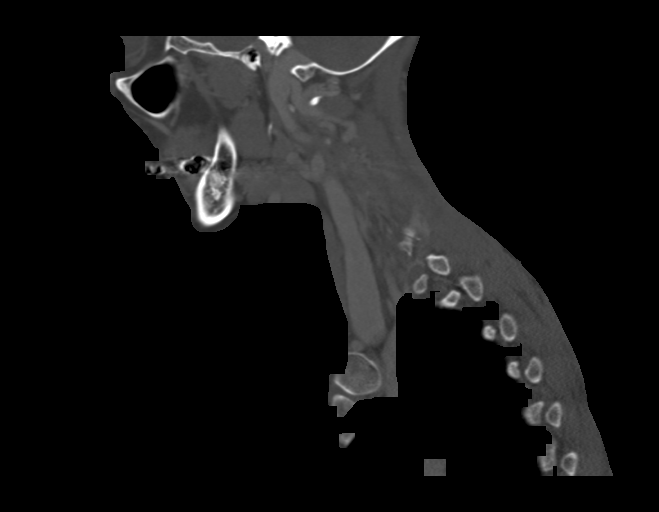
[im 32/77  bone]
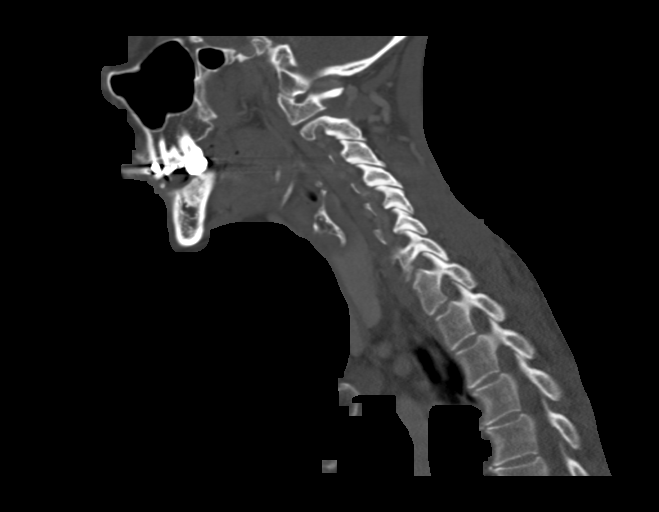
[im 39/77  soft-tissue]
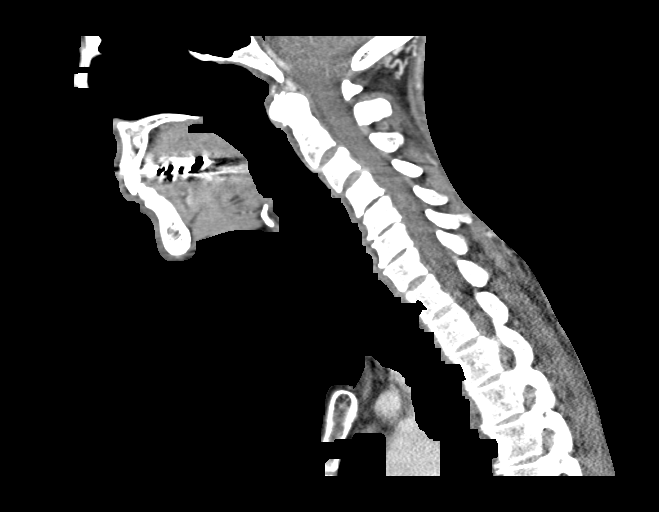
[im 39/77  bone]
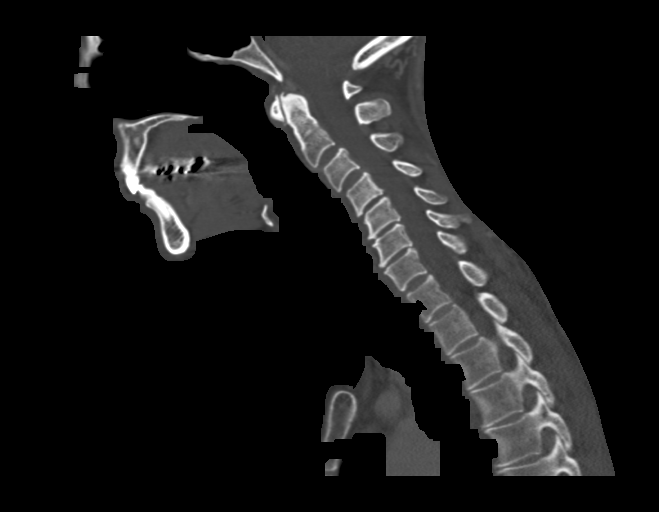
[im 45/77  bone]
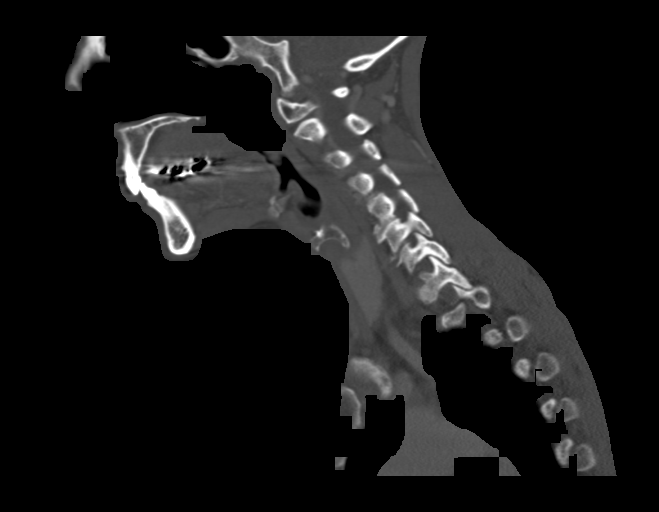
[im 51/77  bone]
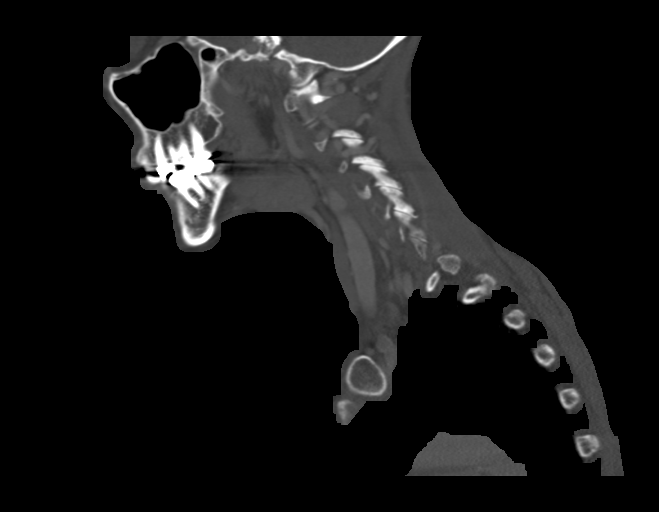

[13 of 33 positions shown; findings below may reference images not displayed]

FINDINGS: PHARYNX AND LARYNX: The nasopharynx, oropharynx and larynx are
normal. Visible portions of the oral cavity, tongue base and floor
of mouth are normal. Normal epiglottis, vallecula and pyriform
sinuses. The larynx is normal. No retropharyngeal abscess, effusion
or lymphadenopathy.

SALIVARY GLANDS: Normal parotid, submandibular and sublingual
glands.

THYROID: Normal.

LYMPH NODES: No enlarged or abnormal density lymph nodes.

VASCULAR: Major cervical vessels are patent.

LIMITED INTRACRANIAL: Normal.

VISUALIZED ORBITS: Normal.

MASTOIDS AND VISUALIZED PARANASAL SINUSES: No fluid levels or
advanced mucosal thickening. No mastoid effusion.

SKELETON: No bony spinal canal stenosis. No lytic or blastic
lesions.

UPPER CHEST: Clear.

OTHER: None.
IMPRESSION: Normal CT of the neck.

## 2022-02-05 ENCOUNTER — Emergency Department (HOSPITAL_COMMUNITY): Payer: BC Managed Care – PPO

## 2022-02-05 ENCOUNTER — Other Ambulatory Visit: Payer: Self-pay

## 2022-02-05 ENCOUNTER — Encounter (HOSPITAL_COMMUNITY): Payer: Self-pay | Admitting: Emergency Medicine

## 2022-02-05 ENCOUNTER — Emergency Department (HOSPITAL_COMMUNITY)
Admission: EM | Admit: 2022-02-05 | Discharge: 2022-02-06 | Disposition: A | Discharge status: Home / Self Care | Payer: BC Managed Care – PPO | Attending: Emergency Medicine | Admitting: Emergency Medicine

## 2022-02-05 DIAGNOSIS — R002 Palpitations: Secondary | ICD-10-CM | POA: Diagnosis present

## 2022-02-05 DIAGNOSIS — R0602 Shortness of breath: Secondary | ICD-10-CM | POA: Insufficient documentation

## 2022-02-05 DIAGNOSIS — R079 Chest pain, unspecified: Secondary | ICD-10-CM | POA: Insufficient documentation

## 2022-02-05 LAB — CBC
HCT: 37.7 % (ref 36.0–46.0)
Hemoglobin: 12.3 g/dL (ref 12.0–15.0)
MCH: 30.2 pg (ref 26.0–34.0)
MCHC: 32.6 g/dL (ref 30.0–36.0)
MCV: 92.6 fL (ref 80.0–100.0)
Platelets: 245 10*3/uL (ref 150–400)
RBC: 4.07 MIL/uL (ref 3.87–5.11)
RDW: 12.8 % (ref 11.5–15.5)
WBC: 7.1 10*3/uL (ref 4.0–10.5)
nRBC: 0 % (ref 0.0–0.2)

## 2022-02-05 LAB — BASIC METABOLIC PANEL
Anion gap: 10 (ref 5–15)
BUN: 10 mg/dL (ref 6–20)
CO2: 26 mmol/L (ref 22–32)
Calcium: 9.5 mg/dL (ref 8.9–10.3)
Chloride: 103 mmol/L (ref 98–111)
Creatinine, Ser: 0.62 mg/dL (ref 0.44–1.00)
GFR, Estimated: >60 mL/min (ref >60)
Glucose, Bld: 109 mg/dL — ABNORMAL HIGH (ref 70–99)
Potassium: 3.9 mmol/L (ref 3.5–5.1)
Sodium: 139 mmol/L (ref 135–145)

## 2022-02-05 LAB — D-DIMER, QUANTITATIVE: D-Dimer, Quant: <0.27 ug/mL-FEU (ref 0.00–0.50)

## 2022-02-05 LAB — TROPONIN I (HIGH SENSITIVITY)
Troponin I (High Sensitivity): <2 ng/L (ref <18)
Troponin I (High Sensitivity): <2 ng/L (ref <18)

## 2022-02-05 MED ORDER — SUCRALFATE 1 G PO TABS: 1.0000 g | ORAL_TABLET | Freq: Three times a day (TID) | ORAL | 0 refills | Status: DC

## 2022-02-05 MED ORDER — OMEPRAZOLE 20 MG PO CPDR
20.0000 mg | DELAYED_RELEASE_CAPSULE | Freq: Every day | ORAL | 0 refills | Status: DC
Start: 2022-02-05 — End: 2022-02-25

## 2022-02-05 NOTE — ED Provider Triage Note (Signed)
Emergency Medicine Provider Triage Evaluation Note  Carol Morrison , a 56 y.o. female  was evaluated in triage.  Pt complains of chest discomfort and back discomfort over the last few weeks.  Describes it as on and off.  More concerned about her palpitations.  With occasional shortness of breath, further described as "just cannot quite take a deep breath".  Feels as if "a bug is coming on".  Denies congestion, N/V/D, runny nose, fevers, chills.  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  Began first feeling palpitations during her 2 treatments.  Palpitations have then worsened since then.  Review of Systems  Positive:  Negative: See above  Physical Exam  BP 121/71 (BP Location: Left Arm)   Pulse (!) 112   Temp 98.5 F (36.9 C) (Oral)   Resp 14   SpO2 100%  Gen:   Awake, no distress   Resp:  Normal effort  MSK:   Moves extremities without difficulty  Other:  Chest non-TTP.  Back non-TTP.  Upper and lower extremities appear neurovascularly intact.  No pulsatile mass palpated in the abdomen.  Medical Decision Making  Medically screening exam initiated at 2:37 PM.  Appropriate orders placed.  Carol Morrison was informed that the remainder of the evaluation will be completed by another provider, this initial triage assessment does not replace that evaluation, and the importance of remaining in the ED until their evaluation is complete.     Carol Rome, PA-C 59/56/38 1440

## 2022-02-05 NOTE — ED Provider Notes (Signed)
[facility] Emergency Department Provider Note MRN:  [id number]  Arrival date & time: 02/06/22     Chief Complaint   Chest Pain   History of Present Illness   Carol Morrison is a 56 y.o. year-old female presents to the ED with chief complaint of intermittent chest pains for the past week.  She states that the pains come and go.  They are central and radiate to her back.  She states that she has had some associated palpitations.  She reports SOB that is worse at night.  She denies any leg swelling.  She reports 10 car ride 1 week ago.  States she feels like she needs to clear her throat of phlegm.  Questions whether the symptoms might be acid reflux or stress.  History provided by patient.   Review of Systems  Pertinent positive and negative review of systems noted in HPI.    Physical Exam   Vitals:   02/05/22 1648 02/05/22 2210  BP: 122/75 113/76  Pulse: (!) 101 85  Resp: 15 16  Temp: 98.4 F (36.9 C) 98.4 F (36.9 C)  SpO2: 100% 96%    CONSTITUTIONAL:  well-appearing, NAD NEURO:  Alert and oriented x 3, CN 3-12 grossly intact EYES:  eyes equal and reactive ENT/NECK:  Supple, no stridor  CARDIO:  normal rate on my exam, regular rhythm, appears well-perfused  PULM:  No respiratory distress, CTAB GI/GU:  non-distended,  MSK/SPINE:  No gross deformities, no edema, moves all extremities, no calf tenderness SKIN:  no rash, atraumatic   *Additional and/or pertinent findings included in MDM below  Diagnostic and Interventional Summary    EKG Interpretation  Date/Time:  Thursday February 05 2022 14:19:14 EDT Ventricular Rate:  100 PR Interval:  134 QRS Duration: 78 QT Interval:  354 QTC Calculation: 456 R Axis:   30 Text Interpretation: Sinus rhythm with frequent Premature ventricular complexes Nonspecific ST abnormality Abnormal ECG When compared with ECG of 12-Mar-2021 00:02, PREVIOUS ECG IS PRESENT  ST changes in inferior and lateral leads  similar to 2022 Confirmed by Mesner, Corene Cornea [phone]) on 02/06/2022 12:16:00 AM       Labs Reviewed  BASIC METABOLIC PANEL - Abnormal; Notable for the following components:      Result Value   Glucose, Bld 109 (*)    All other components within normal limits  CBC  D-DIMER, QUANTITATIVE  TROPONIN I (HIGH SENSITIVITY)  TROPONIN I (HIGH SENSITIVITY)    DG Chest 2 View  Final Result      Medications - No data to display   Procedures  /  Critical Care Procedures  ED Course and Medical Decision Making  I have reviewed the triage vital signs, the nursing notes, and pertinent available records from the EMR.  Social Determinants Affecting Complexity of Care: Patient has no clinically significant social determinants affecting this chief complaint..   ED Course:    Medical Decision Making Patient here with palpitations, intermittent episodes of chest pain and shortness of breath for the past week or 2.  She states that her chest pain comes and sharp waves and lasts for less than a minute at a time.  She also reports increased palpitations and some shortness of breath at night.  Reports having recently traveled to [facility], approximately 10-hour drive.   She was seen in urgent care earlier today and was sent to the emergency department for further evaluation.  Laboratory work-up in triage she is reassuring, troponins are less <2  x2.  No significant electrolyte derangement.  Blood counts are normal.  I will add on a D-dimer due to the recent long drive, but she does not have any calf tenderness, she is not tachycardic now, and is not hypoxic.  I suspect that PE is less likely.  D-dimer is negative.  HEART score of 2, indicating low risk of MACE.  Amount and/or Complexity of Data Reviewed Labs: ordered.    Details: As discussed above, troponins are negative, D-dimer is negative.  Doubt ACS, doubt PE. Radiology: independent interpretation performed.    Details: CXR ordered in  triage, with independent interpretation by me shows no obvious infiltrate ECG/medicine tests: independent interpretation performed.    Details: EKG ordered in triage, but independently interpreted by me shows frequent PVCs and nonspecific ST changes.  Risk Prescription drug management.     Consultants: No consultations were needed in caring for this patient.   Treatment and Plan: I considered admission due to patient's initial presentation, but after considering the examination and diagnostic results, patient will not require admission and can be discharged with outpatient follow-up.    Final Clinical Impressions(s) / ED Diagnoses     ICD-10-CM   1. Palpitations  R00.2 Ambulatory referral to Cardiology      ED Discharge Orders          Ordered    omeprazole (PRILOSEC) 20 MG capsule  Daily        02/05/22 2357    sucralfate (CARAFATE) 1 g tablet  3 times daily with meals & bedtime        02/05/22 2357    Ambulatory referral to Cardiology       Comments: If you have not heard from the Cardiology office within the next 72 hours please call [phone].   02/05/22 2358              Discharge Instructions Discussed with and Provided to Patient:   Discharge Instructions   None      Montine Circle, PA-C 02/06/22 0017    Mesner, Corene Cornea, MD 02/06/22 [phone]

## 2022-02-05 NOTE — ED Notes (Signed)
Provider at bedside

## 2022-02-05 NOTE — ED Triage Notes (Addendum)
Pt arrives via EMS from MD office. Pt having chest pressure going into her back. Pt also feeling dizzy, complains of palpitations. Pt has episodes of SOB as well. EMS reports occasional PVC's, BP 137/96, HR 94-104 Pt took 324mg  ASA.  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 Pt states she started to feel palpitations during her two treatments. Pt states they have worsened since then.

## 2022-02-20 ENCOUNTER — Encounter: Payer: Self-pay | Admitting: Cardiovascular Disease

## 2022-02-25 ENCOUNTER — Ambulatory Visit: Payer: BC Managed Care – PPO | Attending: Cardiovascular Disease | Admitting: Cardiology

## 2022-02-25 ENCOUNTER — Encounter: Payer: Self-pay | Admitting: Cardiology

## 2022-02-25 ENCOUNTER — Ambulatory Visit: Payer: BC Managed Care – PPO | Admitting: Cardiovascular Disease

## 2022-02-25 ENCOUNTER — Ambulatory Visit (INDEPENDENT_AMBULATORY_CARE_PROVIDER_SITE_OTHER): Payer: BC Managed Care – PPO

## 2022-02-25 VITALS — BP 112/66 | HR 84 | Ht 64.0 in | Wt 119.6 lb

## 2022-02-25 DIAGNOSIS — I493 Ventricular premature depolarization: Secondary | ICD-10-CM | POA: Diagnosis not present

## 2022-02-25 DIAGNOSIS — R002 Palpitations: Secondary | ICD-10-CM

## 2022-02-25 DIAGNOSIS — R0609 Other forms of dyspnea: Secondary | ICD-10-CM | POA: Diagnosis not present

## 2022-02-25 DIAGNOSIS — R079 Chest pain, unspecified: Secondary | ICD-10-CM | POA: Diagnosis not present

## 2022-02-25 MED ORDER — METOPROLOL TARTRATE 50 MG PO TABS: ORAL_TABLET | ORAL | 0 refills | Status: DC

## 2022-02-25 NOTE — Progress Notes (Unsigned)
Enrolled patient for a 7 day Zio XT monitor to be mailed to patients home.  

## 2022-02-25 NOTE — Patient Instructions (Addendum)
Medication Instructions:  Your physician recommends that you continue on your current medications as directed. Please refer to the Current Medication list given to you today.  *If you need a refill on your cardiac medications before your next appointment, please call your pharmacy*  Testing/Procedures: Coronary CTA-see instructions below  Your physician has requested that you have an echocardiogram. Echocardiography is a painless test that uses sound waves to create images of your heart. It provides your doctor with information about the size and shape of your heart and how well your heart's chambers and valves are working. This procedure takes approximately one hour. There are no restrictions for this procedure. Please do NOT wear cologne, perfume, aftershave, or lotions (deodorant is allowed). Please arrive 15 minutes prior to your appointment time.   ZIO XT- Long Term Monitor Instructions   Your physician has requested you wear a ZIO patch monitor for _7_ days.  This is a single patch monitor.   [employer] supplies one patch monitor per enrollment. Additional stickers are not available. Please do not apply patch if you will be having a Nuclear Stress Test, Echocardiogram, Cardiac CT, MRI, or Chest Xray during the period you would be wearing the monitor. The patch cannot be worn during these tests. You cannot remove and re-apply the ZIO XT patch monitor.  Your ZIO patch monitor will be sent Fed Ex from [employer] directly to your home address. It may take 3-5 days to receive your monitor after you have been enrolled.  Once you have received your monitor, please review the enclosed instructions. Your monitor has already been registered assigning a specific monitor serial # to you.  Billing and Patient Assistance Program Information   We have supplied [employer] with any of your insurance information on file for billing purposes. [employer] offers a sliding scale Patient Assistance Program  for patients that do not have insurance, or whose insurance does not completely cover the cost of the ZIO monitor.   You must apply for the Patient Assistance Program to qualify for this discounted rate.     To apply, please call [employer] at [phone], select option 4, then select option 2, and ask to apply for Patient Assistance Program.  Meredeth Ide will ask your household income, and how many people are in your household.  They will quote your out-of-pocket cost based on that information.  [employer] will also be able to set up a 47-month, interest-free payment plan if needed.  Applying the monitor   Shave hair from upper left chest.  Hold abrader disc by orange tab. Rub abrader in 40 strokes over the upper left chest as indicated in your monitor instructions.  Clean area with 4 enclosed alcohol pads. Let dry.  Apply patch as indicated in monitor instructions. Patch will be placed under collarbone on left side of chest with arrow pointing upward.  Rub patch adhesive wings for 2 minutes. Remove white label marked "1". Remove the white label marked "2". Rub patch adhesive wings for 2 additional minutes.  While looking in a mirror, press and release button in center of patch. A small green light will flash 3-4 times. This will be your only indicator that the monitor has been turned on. ?  Do not shower for the first 24 hours. You may shower after the first 24 hours.  Press the button if you feel a symptom. You will hear a small click. Record Date, Time and Symptom in the Patient Logbook.  When you are ready to remove the  patch, follow instructions on the last 2 pages of the Patient Logbook. Stick patch monitor onto the last page of Patient Logbook.  Place Patient Logbook in the blue and white box.  Use locking tab on box and tape box closed securely.  The blue and white box has prepaid postage on it. Please place it in the mailbox as soon as possible. Your physician should have your test results  approximately 7 days after the monitor has been mailed back to [facility].  Call [facility] at [phone] if you have questions regarding your ZIO XT patch monitor. Call them immediately if you see an orange light blinking on your monitor.  If your monitor falls off in less than 4 days, contact our Monitor department at [phone]. ?If your monitor becomes loose or falls off after 4 days call [employer] at [phone] for suggestions on securing your monitor.?   Follow-Up: At [facility], you and your health needs are our priority.  As part of our continuing mission to provide you with exceptional heart care, we have created designated Provider Care Teams.  These Care Teams include your primary Cardiologist (physician) and Advanced Practice Providers (APPs -  Physician Assistants and Nurse Practitioners) who all work together to provide you with the care you need, when you need it.  We recommend signing up for the patient portal called "MyChart".  Sign up information is provided on this After Visit Summary.  MyChart is used to connect with patients for Virtual Visits (Telemedicine).  Patients are able to view lab/test results, encounter notes, upcoming appointments, etc.  Non-urgent messages can be sent to your provider as well.   To learn more about what you can do with MyChart, go to [email or web address].    Your next appointment:   3 month(s)  The format for your next appointment:   In Person  Provider:   Dr. Gardiner Rhyme  Other Instructions   Your cardiac CT will be scheduled at one of the below locations:   [facility] [address] [place], [place] [address] [phone]   If scheduled at [facility], please arrive at the [facility] and Children's Entrance (Entrance C2) of [facility] 30 minutes prior to test start time. You can use the FREE valet parking offered at entrance C (encouraged to control the heart rate  for the test)  Proceed to the [facility] Radiology Department (first floor) to check-in and test prep.  All radiology patients and guests should use entrance C2 at [facility], accessed from [facility], even though the hospital's physical address listed is [address]     Please follow these instructions carefully (unless otherwise directed):  On the Night Before the Test: Be sure to Drink plenty of water. Do not consume any caffeinated/decaffeinated beverages or chocolate 12 hours prior to your test. Do not take any antihistamines 12 hours prior to your test.  On the Day of the Test: Drink plenty of water until 1 hour prior to the test. Do not eat any food 1 hour prior to test. You may take your regular medications prior to the test.  Take metoprolol 50 mg (Lopressor) two hours prior to test. FEMALES- please wear underwire-free bra if available, avoid dresses & tight clothing  After the Test: Drink plenty of water. After receiving IV contrast, you may experience a mild flushed feeling. This is normal. On occasion, you may experience a mild rash up to 24 hours after  the test. This is not dangerous. If this occurs, you can take Benadryl 25 mg and increase your fluid intake. If you experience trouble breathing, this can be serious. If it is severe call 911 IMMEDIATELY. If it is mild, please call our office. If you take any of these medications: Glipizide/Metformin, Avandament, Glucavance, please do not take 48 hours after completing test unless otherwise instructed.  We will call to schedule your test 2-4 weeks out understanding that some insurance companies will need an authorization prior to the service being performed.   For non-scheduling related questions, please contact the cardiac imaging nurse navigator should you have any questions/concerns: Rockwell Alexandria, Cardiac Imaging Nurse Navigator Larey Brick, Cardiac Imaging Nurse Navigator [place]  [facility] Direct Office Dial: [phone]   For scheduling needs, including cancellations and rescheduling, please call [place], [phone].

## 2022-02-25 NOTE — Progress Notes (Signed)
Cardiology Office Note:    Date:  02/25/2022   ID:  Carol Morrison, DOB [birth year], MRN [id number]  PCP:  Bernerd Limbo, MD  Cardiologist:  None  Electrophysiologist:  None   Referring MD: Montine Circle, PA-C   Chief Complaint  Patient presents with   Chest Pain    History of Present Illness:    Carol Morrison is a 56 y.o. female with no significant past medical history who presents as an ED follow-up for palpitations and chest pain.  She was seen in the ED for palpitations and chest pain on 02/06/2022.  Work-up including troponins and D-dimer were negative.  She reports she has been having tightness in the chest and back.  Started about 2 weeks before her ED visit.  Reports feels like pressure in center to left chest.  Typically short duration but the day she went to the ED had been lasting longer.  Also reports she gets short of breath.  States that she walks 5-10,000 steps per day, gets short of breath with exertion but has not noted chest pain.  She is also been having palpitations where feels like heart is fluttering.  Lasts short duration.  She monitors her heart rate, has noted it can go up to 130s at rest.  Notes that symptoms are worse if she drinks tea.  She typically does not have any caffeine intake and almost no alcohol intake.  Never smoked.  Family history includes father had aortic aneurysm, mother has pacemaker, and paternal grandfather died of MI.    No past medical history on file.  Past Surgical History:  Procedure Laterality Date   AUGMENTATION MAMMAPLASTY Bilateral    FOOT SURGERY      Current Medications: Current Meds  Medication Sig   ascorbic acid (VITAMIN C) 500 MG tablet Take 1 tablet by mouth daily.   Cholecalciferol 50 MCG (2000 UT) CAPS Take by mouth.   cyanocobalamin (VITAMIN B12) 1000 MCG tablet Take by mouth.   metoprolol tartrate (LOPRESSOR) 50 MG tablet Take 50 mg (1 tablet) TWO hours prior to CT scan   valACYclovir (VALTREX) 500 MG tablet  Take 500 mg by mouth daily.     Allergies:   Patient has no known allergies.   Social History   Socioeconomic History   Marital status: Married    Spouse name: Not on file   Number of children: Not on file   Years of education: Not on file   Highest education level: Not on file  Occupational History   Not on file  Tobacco Use   Smoking status: Never   Smokeless tobacco: Never  Substance and Sexual Activity   Alcohol use: No   Drug use: No   Sexual activity: Not on file  Other Topics Concern   Not on file  Social History Narrative   Not on file   Social Determinants of Health   Financial Resource Strain: Not on file  Food Insecurity: Not on file  Transportation Needs: Not on file  Physical Activity: Not on file  Stress: Not on file  Social Connections: Not on file     Family History: The patient's family history includes Breast cancer in her cousin, cousin, maternal aunt, and maternal aunt.  ROS:   Please see the history of present illness.     All other systems reviewed and are negative.  EKGs/Labs/Other Studies Reviewed:    The following studies were reviewed today:   EKG:   02/25/2022: Sinus rhythm, rate 84, PACs/PVCs  Recent Labs: 03/12/2021: ALT 12 02/05/2022: BUN 10; Creatinine, Ser 0.62; Hemoglobin 12.3; Platelets 245; Potassium 3.9; Sodium 139  Recent Lipid Panel No results found for: "CHOL", "TRIG", "HDL", "CHOLHDL", "VLDL", "LDLCALC", "LDLDIRECT"  Physical Exam:    VS:  BP 112/66 (BP Location: Left Arm, Patient Position: Sitting, Cuff Size: Normal)   Pulse 84   Ht 5\' 4"  (1.626 m)   Wt 119 lb 9.6 oz (54.3 kg)   SpO2 99%   BMI 20.53 kg/m     Wt Readings from Last 3 Encounters:  02/25/22 119 lb 9.6 oz (54.3 kg)  03/11/21 120 lb (54.4 kg)  02/23/17 105 lb (47.6 kg)     GEN:  Well nourished, well developed in no acute distress HEENT: Normal NECK: No JVD; No carotid bruits LYMPHATICS: No lymphadenopathy CARDIAC: Irregular, normal rate,  no murmurs, rubs, gallops RESPIRATORY:  Clear to auscultation without rales, wheezing or rhonchi  ABDOMEN: Soft, non-tender, non-distended MUSCULOSKELETAL:  No edema; No deformity  SKIN: Warm and dry NEUROLOGIC:  Alert and oriented x 3 PSYCHIATRIC:  Normal affect   ASSESSMENT:    1. Chest pain of uncertain etiology   2. DOE (dyspnea on exertion)   3. Palpitations   4. PVC's (premature ventricular contractions)    PLAN:    Chest pain/DOE: Chest pain is atypical in description but also having dyspnea on exertion that could represent anginal equivalent.  Does have CAD risk factors (age, hyperlipidemia).  Recommend coronary CTA to rule out obstructive CAD.  Will give Lopressor 50 mg prior to study.  Palpitations/PVCs: She reports having palpitations, EKG shows PVCs.  Recommend Zio patch x7 days to evaluate for arrhythmia and quantify PVC burden.  Recommend echocardiogram to rule out structural heart disease.  Hyperlipidemia: LDL 147 on 02/09/22.  Follow-up results of coronary CTA to guide how aggressive to be in lowering cholesterol.  RTC in 3 months   Medication Adjustments/Labs and Tests Ordered: Current medicines are reviewed at length with the patient today.  Concerns regarding medicines are outlined above.  Orders Placed This Encounter  Procedures   CT CORONARY MORPH W/CTA COR W/SCORE W/CA W/CM &/OR WO/CM   LONG TERM MONITOR (3-14 DAYS)   EKG 12-Lead   ECHOCARDIOGRAM COMPLETE   Meds ordered this encounter  Medications   metoprolol tartrate (LOPRESSOR) 50 MG tablet    Sig: Take 50 mg (1 tablet) TWO hours prior to CT scan    Dispense:  1 tablet    Refill:  0    Patient Instructions  Medication Instructions:  Your physician recommends that you continue on your current medications as directed. Please refer to the Current Medication list given to you today.  *If you need a refill on your cardiac medications before your next appointment, please call your  pharmacy*  Testing/Procedures: Coronary CTA-see instructions below  Your physician has requested that you have an echocardiogram. Echocardiography is a painless test that uses sound waves to create images of your heart. It provides your doctor with information about the size and shape of your heart and how well your heart's chambers and valves are working. This procedure takes approximately one hour. There are no restrictions for this procedure. Please do NOT wear cologne, perfume, aftershave, or lotions (deodorant is allowed). Please arrive 15 minutes prior to your appointment time.   ZIO XT- Long Term Monitor Instructions   Your physician has requested you wear a ZIO patch monitor for _7_ days.  This is a single patch monitor.   [employer] supplies  one patch monitor per enrollment. Additional stickers are not available. Please do not apply patch if you will be having a Nuclear Stress Test, Echocardiogram, Cardiac CT, MRI, or Chest Xray during the period you would be wearing the monitor. The patch cannot be worn during these tests. You cannot remove and re-apply the ZIO XT patch monitor.  Your ZIO patch monitor will be sent Fed Ex from [employer] directly to your home address. It may take 3-5 days to receive your monitor after you have been enrolled.  Once you have received your monitor, please review the enclosed instructions. Your monitor has already been registered assigning a specific monitor serial # to you.  Billing and Patient Assistance Program Information   We have supplied [employer] with any of your insurance information on file for billing purposes. [employer] offers a sliding scale Patient Assistance Program for patients that do not have insurance, or whose insurance does not completely cover the cost of the ZIO monitor.   You must apply for the Patient Assistance Program to qualify for this discounted rate.     To apply, please call [employer] at [phone], select option 4, then  select option 2, and ask to apply for Patient Assistance Program.  Meredeth Ide will ask your household income, and how many people are in your household.  They will quote your out-of-pocket cost based on that information.  [employer] will also be able to set up a 71-month, interest-free payment plan if needed.  Applying the monitor   Shave hair from upper left chest.  Hold abrader disc by orange tab. Rub abrader in 40 strokes over the upper left chest as indicated in your monitor instructions.  Clean area with 4 enclosed alcohol pads. Let dry.  Apply patch as indicated in monitor instructions. Patch will be placed under collarbone on left side of chest with arrow pointing upward.  Rub patch adhesive wings for 2 minutes. Remove white label marked "1". Remove the white label marked "2". Rub patch adhesive wings for 2 additional minutes.  While looking in a mirror, press and release button in center of patch. A small green light will flash 3-4 times. This will be your only indicator that the monitor has been turned on. ?  Do not shower for the first 24 hours. You may shower after the first 24 hours.  Press the button if you feel a symptom. You will hear a small click. Record Date, Time and Symptom in the Patient Logbook.  When you are ready to remove the patch, follow instructions on the last 2 pages of the Patient Logbook. Stick patch monitor onto the last page of Patient Logbook.  Place Patient Logbook in the blue and white box.  Use locking tab on box and tape box closed securely.  The blue and white box has prepaid postage on it. Please place it in the mailbox as soon as possible. Your physician should have your test results approximately 7 days after the monitor has been mailed back to [facility].  Call [facility] Customer Care at [phone] if you have questions regarding your ZIO XT patch monitor. Call them immediately if you see an orange light blinking on your monitor.  If your monitor falls  off in less than 4 days, contact our Monitor department at [phone]. ?If your monitor becomes loose or falls off after 4 days call [employer] at [phone] for suggestions on securing your monitor.?   Follow-Up: At [facility], you and your health needs are our  priority.  As part of our continuing mission to provide you with exceptional heart care, we have created designated Provider Care Teams.  These Care Teams include your primary Cardiologist (physician) and Advanced Practice Providers (APPs -  Physician Assistants and Nurse Practitioners) who all work together to provide you with the care you need, when you need it.  We recommend signing up for the patient portal called "MyChart".  Sign up information is provided on this After Visit Summary.  MyChart is used to connect with patients for Virtual Visits (Telemedicine).  Patients are able to view lab/test results, encounter notes, upcoming appointments, etc.  Non-urgent messages can be sent to your provider as well.   To learn more about what you can do with MyChart, go to [email or web address].    Your next appointment:   3 month(s)  The format for your next appointment:   In Person  Provider:   Dr. Bjorn Pippin  Other Instructions   Your cardiac CT will be scheduled at one of the below locations:   [facility] [address] [place], [place] [address] [phone]   If scheduled at [facility], please arrive at the [facility] and Children's Entrance (Entrance C2) of [facility] 30 minutes prior to test start time. You can use the FREE valet parking offered at entrance C (encouraged to control the heart rate for the test)  Proceed to the [facility] Radiology Department (first floor) to check-in and test prep.  All radiology patients and guests should use entrance C2 at [facility], accessed from [facility], even though the hospital's physical address listed is [address].     Please follow these instructions carefully (unless otherwise directed):  On the Night Before the Test: Be sure to Drink plenty of water. Do not consume any caffeinated/decaffeinated beverages or chocolate 12 hours prior to your test. Do not take any antihistamines 12 hours prior to your test.  On the Day of the Test: Drink plenty of water until 1 hour prior to the test. Do not eat any food 1 hour prior to test. You may take your regular medications prior to the test.  Take metoprolol 50 mg (Lopressor) two hours prior to test. FEMALES- please wear underwire-free bra if available, avoid dresses & tight clothing  After the Test: Drink plenty of water. After receiving IV contrast, you may experience a mild flushed feeling. This is normal. On occasion, you may experience a mild rash up to 24 hours after the test. This is not dangerous. If this occurs, you can take Benadryl 25 mg and increase your fluid intake. If you experience trouble breathing, this can be serious. If it is severe call 911 IMMEDIATELY. If it is mild, please call our office. If you take any of these medications: Glipizide/Metformin, Avandament, Glucavance, please do not take 48 hours after completing test unless otherwise instructed.  We will call to schedule your test 2-4 weeks out understanding that some insurance companies will need an authorization prior to the service being performed.   For non-scheduling related questions, please contact the cardiac imaging nurse navigator should you have any questions/concerns: Rockwell Alexandria, Cardiac Imaging Nurse Navigator Larey Brick, Cardiac Imaging Nurse Navigator [facility] Direct Office Dial: [phone]   For scheduling needs, including cancellations and rescheduling, please call [place], [phone].           Signed, Little Ishikawa, MD  02/25/2022 4:57 PM  [facility]

## 2022-02-26 ENCOUNTER — Telehealth: Payer: Self-pay | Admitting: Cardiology

## 2022-02-26 NOTE — Telephone Encounter (Signed)
Patient wants to speak to nurse about something else that maybe contributing to the extra heart beat.  She has cervical disc issues.  She is wondering if what's going on in her neck could be doing something to her heart.  She is thinking she may need an MRI of her neck.

## 2022-02-26 NOTE — Telephone Encounter (Signed)
Patient reported that she wanted Dr. Gardiner Rhyme to know that she believes her palpitations are coming from her cervical disc issues. She stated she has 2 opinions for cervical disc fusions. She reported that yesterday, her heart rate went up to the 140s, with SOB. She "felt pretty crappy and had a deep ache in my heart." Recommended to patient that if she has another episode of palpitations with SOB, she needs to be taken to the ED. She verbalized understanding. She asked if there was another ED other than [facility] because of the long wait time there. Gave her other options.

## 2022-02-27 DIAGNOSIS — I493 Ventricular premature depolarization: Secondary | ICD-10-CM

## 2022-02-27 DIAGNOSIS — R002 Palpitations: Secondary | ICD-10-CM

## 2022-03-02 ENCOUNTER — Emergency Department (HOSPITAL_COMMUNITY)
Admission: EM | Admit: 2022-03-02 | Discharge: 2022-03-03 | Discharge status: Against Medical Advice | Payer: BC Managed Care – PPO | Attending: Emergency Medicine | Admitting: Emergency Medicine

## 2022-03-02 DIAGNOSIS — R079 Chest pain, unspecified: Secondary | ICD-10-CM | POA: Diagnosis present

## 2022-03-02 DIAGNOSIS — Z5321 Procedure and treatment not carried out due to patient leaving prior to being seen by health care provider: Secondary | ICD-10-CM | POA: Diagnosis not present

## 2022-03-02 DIAGNOSIS — R0602 Shortness of breath: Secondary | ICD-10-CM | POA: Insufficient documentation

## 2022-03-02 NOTE — ED Triage Notes (Signed)
Patient arrived with complaints of shortness of breath and mid back pressure. States tonight she began trembling and her legs felt weak. Reoccurring symptoms started two weeks ago, seen by cardiology and has monitor in place.

## 2022-03-03 ENCOUNTER — Telehealth: Payer: Self-pay | Admitting: Cardiology

## 2022-03-03 ENCOUNTER — Encounter (HOSPITAL_COMMUNITY): Payer: Self-pay

## 2022-03-03 ENCOUNTER — Emergency Department (HOSPITAL_COMMUNITY): Payer: BC Managed Care – PPO

## 2022-03-03 LAB — BASIC METABOLIC PANEL
Anion gap: 8 (ref 5–15)
BUN: 16 mg/dL (ref 6–20)
CO2: 27 mmol/L (ref 22–32)
Calcium: 9.3 mg/dL (ref 8.9–10.3)
Chloride: 104 mmol/L (ref 98–111)
Creatinine, Ser: 0.59 mg/dL (ref 0.44–1.00)
GFR, Estimated: >60 mL/min (ref >60)
Glucose, Bld: 108 mg/dL — ABNORMAL HIGH (ref 70–99)
Potassium: 3.3 mmol/L — ABNORMAL LOW (ref 3.5–5.1)
Sodium: 139 mmol/L (ref 135–145)

## 2022-03-03 LAB — TROPONIN I (HIGH SENSITIVITY): Troponin I (High Sensitivity): <2 ng/L (ref <18)

## 2022-03-03 LAB — CBC
HCT: 36.1 % (ref 36.0–46.0)
Hemoglobin: 11.7 g/dL — ABNORMAL LOW (ref 12.0–15.0)
MCH: 30.2 pg (ref 26.0–34.0)
MCHC: 32.4 g/dL (ref 30.0–36.0)
MCV: 93 fL (ref 80.0–100.0)
Platelets: 213 10*3/uL (ref 150–400)
RBC: 3.88 MIL/uL (ref 3.87–5.11)
RDW: 13 % (ref 11.5–15.5)
WBC: 4.6 10*3/uL (ref 4.0–10.5)
nRBC: 0 % (ref 0.0–0.2)

## 2022-03-03 NOTE — Telephone Encounter (Signed)
Would continue with plan for coronary CTA, echocardiogram, and Zio patch.  Will follow-up results.  Back pain less likely cardiac, recommend follow-up with PCP

## 2022-03-03 NOTE — Telephone Encounter (Signed)
Spoke with patient who stated that last night, when she was in bed, she felt SOB and had a cramping in her chest. She also felt her legs tremble, "trembling fits" and back pressure, along with numbness in arms and shins. She went to the ED, had blood work, EKG, CXR. She eloped from ED because "I felt better:" Patient asked if the back pressure is heart-related. Completes Zio this Friday 11/10. She stated her back feels better when she is standing. Recommended she contact PCP and neurosurgeon. Please advise.

## 2022-03-03 NOTE — ED Provider Triage Note (Signed)
Emergency Medicine Provider Triage Evaluation Note  Carol Morrison , a 56 y.o. female  was evaluated in triage.  Pt complains of shortness of breath and pressure sensation to her chest onset 2 weeks.  No states these episodes have been at night when she lays down.  Also notes that she feels the pain she begins to tremble and her legs feel weak.  She was recently evaluated by cardiology and has a Holter monitor in place.  Denies being notified of any abnormal heart rhythms.  Review of Systems  Positive:  Negative:   Physical Exam  There were no vitals taken for this visit. Gen:   Awake, no distress   Resp:  Normal effort  MSK:   Moves extremities without difficulty  Other:  No chest wall tenderness to palpation  Medical Decision Making  Medically screening exam initiated at 12:03 AM.  Appropriate orders placed.  Nancyann Cotterman was informed that the remainder of the evaluation will be completed by another provider, this initial triage assessment does not replace that evaluation, and the importance of remaining in the ED until their evaluation is complete.  Work-up initiated   Waylen Depaolo A, PA-C 03/03/22 0004

## 2022-03-03 NOTE — Telephone Encounter (Signed)
Pt states that when she lays down she has pressure in her back and starts to tremble. Pt says she went to the ED regarding this. This keeps her up at night. Pt would like a callback regarding this matter. Please advise

## 2022-03-05 NOTE — Telephone Encounter (Signed)
Patient was calling back for an update. Schd an appt for tomorrow at 1

## 2022-03-05 NOTE — Telephone Encounter (Signed)
Attempt to call patient-unable to leave VM, VM full

## 2022-03-05 NOTE — Progress Notes (Signed)
Cardiology Office Note:    Date:  03/06/2022   ID:  Carol Morrison, DOB 1965-05-08, MRN [id number]  PCP:  Tracey Harries, MD  Cardiologist:  None  Electrophysiologist:  None   Referring MD: Tracey Harries, MD   Chief Complaint  Patient presents with   Shortness of Breath    History of Present Illness:    Carol Morrison is a 56 y.o. female with no significant past medical history who presents for follow-up.  She was seen in the ED for palpitations and chest pain on 02/06/2022.  Work-up including troponins and D-dimer were negative.  She reports she has been having tightness in the chest and back.  Started about 2 weeks before her ED visit.  Reports feels like pressure in center to left chest.  Typically short duration but the day she went to the ED had been lasting longer.  Also reports she gets short of breath.  States that she walks 5-10,000 steps per day, gets short of breath with exertion but has not noted chest pain.  She is also been having palpitations where feels like heart is fluttering.  Lasts short duration.  She monitors her heart rate, has noted it can go up to 130s at rest.  Notes that symptoms are worse if she drinks tea.  She typically does not have any caffeine intake and almost no alcohol intake.  Never smoked.  Family history includes father had aortic aneurysm, mother has pacemaker, and paternal grandfather died of MI.  She was seen in the ED again for chest pain and shortness of breath on 11/6.  Work-up negative.  Reports she has been having significant shortness of breath, particular when she lies down at night.  She is only sleeping for few hours at night because of her shortness of breath.  No past medical history on file.  Past Surgical History:  Procedure Laterality Date   AUGMENTATION MAMMAPLASTY Bilateral    FOOT SURGERY      Current Medications: Current Meds  Medication Sig   ascorbic acid (VITAMIN C) 500 MG tablet Take 1 tablet by mouth daily.    Cholecalciferol 50 MCG (2000 UT) CAPS Take by mouth.   cyanocobalamin (VITAMIN B12) 1000 MCG tablet Take by mouth.   metoprolol tartrate (LOPRESSOR) 50 MG tablet Take 50 mg (1 tablet) TWO hours prior to CT scan   valACYclovir (VALTREX) 500 MG tablet Take 500 mg by mouth daily.     Allergies:   Patient has no known allergies.   Social History   Socioeconomic History   Marital status: Married    Spouse name: Not on file   Number of children: Not on file   Years of education: Not on file   Highest education level: Not on file  Occupational History   Not on file  Tobacco Use   Smoking status: Never   Smokeless tobacco: Never  Substance and Sexual Activity   Alcohol use: No   Drug use: No   Sexual activity: Not on file  Other Topics Concern   Not on file  Social History Narrative   Not on file   Social Determinants of Health   Financial Resource Strain: Not on file  Food Insecurity: Not on file  Transportation Needs: Not on file  Physical Activity: Not on file  Stress: Not on file  Social Connections: Not on file     Family History: The patient's family history includes Breast cancer in her cousin, cousin, maternal aunt, and maternal aunt.  ROS:   Please see the history of present illness.     All other systems reviewed and are negative.  EKGs/Labs/Other Studies Reviewed:    The following studies were reviewed today:   EKG:   02/25/2022: Sinus rhythm, rate 84, PACs/PVCs  Recent Labs: 03/12/2021: ALT 12 03/03/2022: BUN 16; Creatinine, Ser 0.59; Hemoglobin 11.7; Platelets 213; Potassium 3.3; Sodium 139  Recent Lipid Panel No results found for: "CHOL", "TRIG", "HDL", "CHOLHDL", "VLDL", "LDLCALC", "LDLDIRECT"  Physical Exam:    VS:  BP 103/68   Pulse 99   Ht 5' 3.5" (1.613 m)   Wt 116 lb 3.2 oz (52.7 kg)   SpO2 98%   BMI 20.26 kg/m     Wt Readings from Last 3 Encounters:  03/06/22 116 lb 3.2 oz (52.7 kg)  02/25/22 119 lb 9.6 oz (54.3 kg)  03/11/21 120 lb  (54.4 kg)     GEN:  Well nourished, well developed in no acute distress HEENT: Normal NECK: No JVD; No carotid bruits LYMPHATICS: No lymphadenopathy CARDIAC: Irregular, normal rate, no murmurs, rubs, gallops RESPIRATORY:  Clear to auscultation without rales, wheezing or rhonchi  ABDOMEN: Soft, non-tender, non-distended MUSCULOSKELETAL:  No edema; No deformity  SKIN: Warm and dry NEUROLOGIC:  Alert and oriented x 3 PSYCHIATRIC:  Normal affect   ASSESSMENT:    1. Shortness of breath   2. Chest pain of uncertain etiology   3. Hypokalemia   4. PVC's (premature ventricular contractions)   5. Hyperlipidemia, unspecified hyperlipidemia type     PLAN:    Chest pain/DOE: Chest pain is atypical in description but also having dyspnea on exertion that could represent anginal equivalent.  Does have CAD risk factors (age, hyperlipidemia).  Recommend coronary CTA to rule out obstructive CAD.  Will give Lopressor 50 mg prior to study.  She is having significant shortness of breath, will also check PFTs.  Lightheadedness: Orthostatics in clinic today show no drop in blood pressure with standing but did have 30 bpm increase in heart rate.  Recommend stay well-hydrated.  Hypokalemia: Labs at ED recently showed mild hypokalemia.  We will recheck BMET/magnesium  Palpitations/PVCs: She reports having palpitations, EKG shows PVCs.  Recommend Zio patch x7 days to evaluate for arrhythmia and quantify PVC burden.  Recommend echocardiogram to rule out structural heart disease.  Hyperlipidemia: LDL 147 on 02/09/22.  Follow-up results of coronary CTA to guide how aggressive to be in lowering cholesterol.  RTC in 3 months   Medication Adjustments/Labs and Tests Ordered: Current medicines are reviewed at length with the patient today.  Concerns regarding medicines are outlined above.  Orders Placed This Encounter  Procedures   Basic metabolic panel   Brain natriuretic peptide   Magnesium   Pulmonary  function test   No orders of the defined types were placed in this encounter.   Patient Instructions  Medication Instructions:  Your physician recommends that you continue on your current medications as directed. Please refer to the Current Medication list given to you today.  *If you need a refill on your cardiac medications before your next appointment, please call your pharmacy*   Lab Work: BMET, Mag, BNP today  If you have labs (blood work) drawn today and your tests are completely normal, you will receive your results only by: MyChart Message (if you have MyChart) OR A paper copy in the mail If you have any lab test that is abnormal or we need to change your treatment, we will call you to review the  results.   Testing/Procedures: Your physician has recommended that you have a pulmonary function test. Pulmonary Function Tests are a group of tests that measure how well air moves in and out of your lungs.  Follow-Up: At [facility], you and your health needs are our priority.  As part of our continuing mission to provide you with exceptional heart care, we have created designated Provider Care Teams.  These Care Teams include your primary Cardiologist (physician) and Advanced Practice Providers (APPs -  Physician Assistants and Nurse Practitioners) who all work together to provide you with the care you need, when you need it.  We recommend signing up for the patient portal called "MyChart".  Sign up information is provided on this After Visit Summary.  MyChart is used to connect with patients for Virtual Visits (Telemedicine).  Patients are able to view lab/test results, encounter notes, upcoming appointments, etc.  Non-urgent messages can be sent to your provider as well.   To learn more about what you can do with MyChart, go to [email or web address].    Your next appointment:   As scheduled with Dr. Bjorn Pippin      Signed, Little Ishikawa, MD  03/06/2022  10:20 AM    [facility]

## 2022-03-05 NOTE — Telephone Encounter (Signed)
Spoke with pt, aware of dr schumann's recommendations. She had made an appointment to see him tomorrow because she is not able to manage her symptoms. She would like for dr schumann's nurse to call her.

## 2022-03-06 ENCOUNTER — Ambulatory Visit: Payer: BC Managed Care – PPO | Attending: Cardiology | Admitting: Cardiology

## 2022-03-06 ENCOUNTER — Encounter: Payer: Self-pay | Admitting: Cardiology

## 2022-03-06 VITALS — BP 103/68 | HR 99 | Ht 63.5 in | Wt 116.2 lb

## 2022-03-06 DIAGNOSIS — E876 Hypokalemia: Secondary | ICD-10-CM | POA: Diagnosis not present

## 2022-03-06 DIAGNOSIS — I493 Ventricular premature depolarization: Secondary | ICD-10-CM

## 2022-03-06 DIAGNOSIS — R079 Chest pain, unspecified: Secondary | ICD-10-CM | POA: Diagnosis not present

## 2022-03-06 DIAGNOSIS — E785 Hyperlipidemia, unspecified: Secondary | ICD-10-CM

## 2022-03-06 DIAGNOSIS — R0602 Shortness of breath: Secondary | ICD-10-CM | POA: Diagnosis not present

## 2022-03-06 NOTE — Patient Instructions (Signed)
Medication Instructions:  Your physician recommends that you continue on your current medications as directed. Please refer to the Current Medication list given to you today.  *If you need a refill on your cardiac medications before your next appointment, please call your pharmacy*   Lab Work: BMET, Mag, BNP today  If you have labs (blood work) drawn today and your tests are completely normal, you will receive your results only by: MyChart Message (if you have MyChart) OR A paper copy in the mail If you have any lab test that is abnormal or we need to change your treatment, we will call you to review the results.   Testing/Procedures: Your physician has recommended that you have a pulmonary function test. Pulmonary Function Tests are a group of tests that measure how well air moves in and out of your lungs.  Follow-Up: At [facility], you and your health needs are our priority.  As part of our continuing mission to provide you with exceptional heart care, we have created designated Provider Care Teams.  These Care Teams include your primary Cardiologist (physician) and Advanced Practice Providers (APPs -  Physician Assistants and Nurse Practitioners) who all work together to provide you with the care you need, when you need it.  We recommend signing up for the patient portal called "MyChart".  Sign up information is provided on this After Visit Summary.  MyChart is used to connect with patients for Virtual Visits (Telemedicine).  Patients are able to view lab/test results, encounter notes, upcoming appointments, etc.  Non-urgent messages can be sent to your provider as well.   To learn more about what you can do with MyChart, go to [email or web address].    Your next appointment:   As scheduled with Dr. Bjorn Pippin

## 2022-03-07 LAB — MAGNESIUM: Magnesium: 2.2 mg/dL (ref 1.6–2.3)

## 2022-03-07 LAB — BASIC METABOLIC PANEL
BUN/Creatinine Ratio: 20 (ref 9–23)
BUN: 14 mg/dL (ref 6–24)
CO2: 27 mmol/L (ref 20–29)
Calcium: 10 mg/dL (ref 8.7–10.2)
Chloride: 99 mmol/L (ref 96–106)
Creatinine, Ser: 0.71 mg/dL (ref 0.57–1.00)
Glucose: 93 mg/dL (ref 70–99)
Potassium: 4.7 mmol/L (ref 3.5–5.2)
Sodium: 138 mmol/L (ref 134–144)
eGFR: 100 mL/min/{1.73_m2} (ref >59)

## 2022-03-07 LAB — BRAIN NATRIURETIC PEPTIDE: BNP: 19 pg/mL (ref 0.0–100.0)

## 2022-03-09 ENCOUNTER — Ambulatory Visit (HOSPITAL_COMMUNITY)
Admission: RE | Admit: 2022-03-09 | Discharge: 2022-03-09 | Disposition: A | Discharge status: Home / Self Care | Payer: BC Managed Care – PPO | Source: Ambulatory Visit | Attending: Cardiology | Admitting: Cardiology

## 2022-03-09 DIAGNOSIS — R0602 Shortness of breath: Secondary | ICD-10-CM | POA: Diagnosis present

## 2022-03-09 LAB — PULMONARY FUNCTION TEST
DL/VA % pred: 92 %
DL/VA: 3.96 ml/min/mmHg/L
DLCO cor % pred: 83 %
DLCO cor: 16.98 ml/min/mmHg
DLCO unc % pred: 78 %
DLCO unc: 16.02 ml/min/mmHg
FEF 25-75 Pre: 2.3 L/sec
FEF2575-%Pred-Pre: 90 %
FEV1-%Pred-Pre: 86 %
FEV1-Pre: 2.29 L
FEV1FVC-%Pred-Pre: 103 %
FEV6-%Pred-Pre: 85 %
FEV6-Pre: 2.79 L
FEV6FVC-%Pred-Pre: 103 %
FVC-%Pred-Pre: 82 %
FVC-Pre: 2.79 L
Pre FEV1/FVC ratio: 82 %
Pre FEV6/FVC Ratio: 100 %
RV % pred: 100 %
RV: 1.88 L
TLC % pred: 100 %
TLC: 5 L

## 2022-03-09 MED ORDER — ALBUTEROL SULFATE (2.5 MG/3ML) 0.083% IN NEBU: 2.5000 mg | INHALATION_SOLUTION | Freq: Once | RESPIRATORY_TRACT | Status: DC

## 2022-03-10 ENCOUNTER — Encounter (HOSPITAL_BASED_OUTPATIENT_CLINIC_OR_DEPARTMENT_OTHER): Payer: Self-pay

## 2022-03-10 ENCOUNTER — Other Ambulatory Visit (HOSPITAL_BASED_OUTPATIENT_CLINIC_OR_DEPARTMENT_OTHER): Payer: BC Managed Care – PPO

## 2022-03-10 ENCOUNTER — Other Ambulatory Visit (INDEPENDENT_AMBULATORY_CARE_PROVIDER_SITE_OTHER): Payer: BC Managed Care – PPO

## 2022-03-10 DIAGNOSIS — R0609 Other forms of dyspnea: Secondary | ICD-10-CM | POA: Diagnosis not present

## 2022-03-10 DIAGNOSIS — R079 Chest pain, unspecified: Secondary | ICD-10-CM | POA: Diagnosis not present

## 2022-03-10 LAB — ECHOCARDIOGRAM COMPLETE
Area-P 1/2: 4.57 cm2
S' Lateral: 2.14 cm

## 2022-03-11 ENCOUNTER — Emergency Department (HOSPITAL_BASED_OUTPATIENT_CLINIC_OR_DEPARTMENT_OTHER)
Admission: EM | Admit: 2022-03-11 | Discharge: 2022-03-11 | Disposition: A | Discharge status: Home / Self Care | Payer: BC Managed Care – PPO | Attending: Emergency Medicine | Admitting: Emergency Medicine

## 2022-03-11 ENCOUNTER — Other Ambulatory Visit: Payer: Self-pay

## 2022-03-11 ENCOUNTER — Telehealth: Payer: Self-pay | Admitting: Cardiology

## 2022-03-11 ENCOUNTER — Ambulatory Visit: Payer: BC Managed Care – PPO | Admitting: Cardiovascular Disease

## 2022-03-11 ENCOUNTER — Emergency Department (HOSPITAL_BASED_OUTPATIENT_CLINIC_OR_DEPARTMENT_OTHER): Payer: BC Managed Care – PPO | Admitting: Radiology

## 2022-03-11 ENCOUNTER — Telehealth (HOSPITAL_COMMUNITY): Payer: Self-pay | Admitting: Emergency Medicine

## 2022-03-11 ENCOUNTER — Emergency Department (HOSPITAL_BASED_OUTPATIENT_CLINIC_OR_DEPARTMENT_OTHER): Payer: BC Managed Care – PPO

## 2022-03-11 DIAGNOSIS — R0602 Shortness of breath: Secondary | ICD-10-CM | POA: Diagnosis not present

## 2022-03-11 DIAGNOSIS — R42 Dizziness and giddiness: Secondary | ICD-10-CM | POA: Insufficient documentation

## 2022-03-11 DIAGNOSIS — R079 Chest pain, unspecified: Secondary | ICD-10-CM | POA: Insufficient documentation

## 2022-03-11 DIAGNOSIS — R002 Palpitations: Secondary | ICD-10-CM | POA: Insufficient documentation

## 2022-03-11 LAB — BASIC METABOLIC PANEL
Anion gap: 14 (ref 5–15)
BUN: 10 mg/dL (ref 6–20)
CO2: 23 mmol/L (ref 22–32)
Calcium: 10 mg/dL (ref 8.9–10.3)
Chloride: 100 mmol/L (ref 98–111)
Creatinine, Ser: 0.61 mg/dL (ref 0.44–1.00)
GFR, Estimated: >60 mL/min (ref >60)
Glucose, Bld: 106 mg/dL — ABNORMAL HIGH (ref 70–99)
Potassium: 3.6 mmol/L (ref 3.5–5.1)
Sodium: 137 mmol/L (ref 135–145)

## 2022-03-11 LAB — CBC
HCT: 36.8 % (ref 36.0–46.0)
Hemoglobin: 12.3 g/dL (ref 12.0–15.0)
MCH: 30.2 pg (ref 26.0–34.0)
MCHC: 33.4 g/dL (ref 30.0–36.0)
MCV: 90.4 fL (ref 80.0–100.0)
Platelets: 234 10*3/uL (ref 150–400)
RBC: 4.07 MIL/uL (ref 3.87–5.11)
RDW: 13.2 % (ref 11.5–15.5)
WBC: 5.6 10*3/uL (ref 4.0–10.5)
nRBC: 0 % (ref 0.0–0.2)

## 2022-03-11 LAB — URINALYSIS, ROUTINE W REFLEX MICROSCOPIC
Bilirubin Urine: NEGATIVE
Glucose, UA: NEGATIVE mg/dL
Hgb urine dipstick: NEGATIVE
Ketones, ur: 15 mg/dL — AB
Leukocytes,Ua: NEGATIVE
Nitrite: NEGATIVE
Protein, ur: NEGATIVE mg/dL
Specific Gravity, Urine: <1.005 — ABNORMAL LOW (ref 1.005–1.030)
pH: 7 (ref 5.0–8.0)

## 2022-03-11 LAB — TROPONIN I (HIGH SENSITIVITY): Troponin I (High Sensitivity): <2 ng/L (ref <18)

## 2022-03-11 MED ORDER — ONDANSETRON HCL 4 MG/2ML IJ SOLN
4.0000 mg | Freq: Once | INTRAMUSCULAR | Status: AC
  Administered 2022-03-11: 4 mg via INTRAVENOUS
  Filled 2022-03-11: qty 2

## 2022-03-11 MED ORDER — SODIUM CHLORIDE 0.9 % IV BOLUS
1000.0000 mL | Freq: Once | INTRAVENOUS | Status: AC
  Administered 2022-03-11: 1000 mL via INTRAVENOUS

## 2022-03-11 NOTE — Telephone Encounter (Signed)
Pt would like a call back to discuss her results from echo.

## 2022-03-11 NOTE — ED Notes (Signed)
RT in to assess patient d/t complaints of shortness of breath. SpO2 on Room Air 98%. BBS clear. Instructed patient on deep breathing exercises. RN and Provider aware of assessment.

## 2022-03-11 NOTE — ED Notes (Signed)
Pt ambulated to BR without difficulty

## 2022-03-11 NOTE — Telephone Encounter (Signed)
Returned call to patient-patient states she feels like she is on the verge of collapsing.  Reports she is hot, shaky, nauseous and anytime she gets up to do anything she feels weak as if she is going to pass out.   She states she was in bed 10 hours last night but only slept ~3 hours because she woke up shaking.   She states she ate piece of avocado toast this morning and has only been able to drink ~ 18 oz today d/t nausea. She states she has a watch and her O2 was 94-95 last night and has been ok today at 96 or greater.  Reports HR 80-90s today (reports this is high for her).  HR currently 101. Unable to check BP at home.  Reports sharp pain in chest that comes and goes, describes as a "twinge".  No pain at current.  She is also unsure what her results mean.    Discussed echo results with patient.   She is scheduled for coronary CTA tomorrow.   Advised to try to hydrate, eat something, and relax to see if this helps symptoms.  If symptoms persist she will go to ER to be evaluated.  Advised to keep CT appointment tomorrow and follow up Friday.     Will make MD aware.

## 2022-03-11 NOTE — ED Provider Notes (Signed)
[facility] EMERGENC[facility] DEPT Provider Note   CSN: [id number] Arrival date & time: 03/11/22  1723     History  Chief Complaint  Patient presents with   Chest Pain    Carol Morrison is a 56 y.o. female presents to the ED complaining of shortness of breath, chest pain, and dizziness that have been ongoing for the last month.  She states her chest pain is intermittent but has pressure that is constant.  She states her symptoms are worse at night when she tries to lie down to sleep.  States she has not been sleeping well due to feeling like she cannot catch her breath.  Patient is being followed by Dr. Gardiner Rhyme with cardiology and has CTA scheduled for tomorrow.  She has recently had echo done and has worn a long-term monitor.  She is concerned because she continues to have near syncopal episodes and has had nausea with decreased appetite today.  Denies fever, chills, weakness, syncope, headache, numbness.      Home Medications Prior to Admission medications   Medication Sig Start Date End Date Taking? Authorizing Provider  ascorbic acid (VITAMIN C) 500 MG tablet Take 1 tablet by mouth daily. 05/15/09   [provider]  Cholecalciferol 50 MCG (2000 UT) CAPS Take by mouth.    [provider]  cyanocobalamin (VITAMIN B12) 1000 MCG tablet Take by mouth.    [provider]  metoprolol tartrate (LOPRESSOR) 50 MG tablet Take 50 mg (1 tablet) TWO hours prior to CT scan 02/25/22   Donato Heinz, MD  valACYclovir (VALTREX) 500 MG tablet Take 500 mg by mouth daily. 12/05/18   [provider]      Allergies    Patient has no known allergies.    Review of Systems   Review of Systems  Constitutional:  Negative for chills and fever.  Respiratory:  Positive for shortness of breath.   Cardiovascular:  Positive for chest pain and palpitations.  Gastrointestinal:  Positive for nausea. Negative for vomiting.  Neurological:  Positive for tremors  and light-headedness. Negative for syncope, weakness, numbness and headaches.    Physical Exam Updated Vital Signs BP 98/64   Pulse 81   Temp 98 F (36.7 C)   Resp 12   Ht 5' 3.5" (1.613 m)   Wt 52.7 kg   SpO2 93%   BMI 20.26 kg/m  Physical Exam Vitals and nursing note reviewed.  Constitutional:      General: She is not in acute distress.    Appearance: She is ill-appearing. She is not toxic-appearing or diaphoretic.  HENT:     Mouth/Throat:     Mouth: Mucous membranes are moist.     Pharynx: Oropharynx is clear.  Cardiovascular:     Rate and Rhythm: Normal rate. Rhythm irregular. Frequent Extrasystoles are present.    Pulses: Normal pulses.     Heart sounds: Normal heart sounds. No murmur heard. Pulmonary:     Effort: Pulmonary effort is normal. No tachypnea or respiratory distress.     Breath sounds: Normal breath sounds and air entry. No decreased breath sounds or rales.  Abdominal:     General: Abdomen is flat. There is no distension.     Palpations: Abdomen is soft.     Tenderness: There is no abdominal tenderness.  Musculoskeletal:     Right lower leg: No edema.     Left lower leg: No edema.  Skin:    General: Skin is warm and dry.  Capillary Refill: Capillary refill takes less than 2 seconds.     Coloration: Skin is not cyanotic.  Neurological:     Mental Status: She is alert. Mental status is at baseline.  Psychiatric:        Mood and Affect: Mood normal.        Behavior: Behavior normal.     ED Results / Procedures / Treatments   Labs (all labs ordered are listed, but only abnormal results are displayed) Labs Reviewed  BASIC METABOLIC PANEL - Abnormal; Notable for the following components:      Result Value   Glucose, Bld 106 (*)    All other components within normal limits  URINALYSIS, ROUTINE W REFLEX MICROSCOPIC - Abnormal; Notable for the following components:   Color, Urine COLORLESS (*)    Specific Gravity, Urine <1.005 (*)    Ketones, ur  15 (*)    All other components within normal limits  CBC  TROPONIN I (HIGH SENSITIVITY)    EKG None  Radiology DG Chest Portable 1 View  Result Date: 03/11/2022 CLINICAL DATA:  Chest pain. EXAM: PORTABLE CHEST 1 VIEW COMPARISON:  March 11, 2022. FINDINGS: The heart size and mediastinal contours are within normal limits. Both lungs are clear. The visualized skeletal structures are unremarkable. IMPRESSION: No active disease. Electronically Signed   By: Marijo Conception M.D.   On: 03/11/2022 18:19   ECHOCARDIOGRAM COMPLETE  Result Date: 03/10/2022    ECHOCARDIOGRAM REPORT   Patient Name:   Carol Morrison Date of Exam: 03/10/2022 Medical Rec #:  [id number]       Height:       63.5 in Accession #:    [id number]      Weight:       116.2 lb Date of Birth:  26-Aug-1965        BSA:          1.544 m Patient Age:    59 years        BP:           100/60 mmHg Patient Gender: F               HR:           82 bpm. Exam Location:  Outpatient Procedure: 2D Echo, 3D Echo, Cardiac Doppler, Color Doppler and Strain Analysis Indications:    R06.9 DOE  History:        Patient has no prior history of Echocardiogram examinations.                 Signs/Symptoms:Chest Pain; Risk Factors:Non-Smoker. Tightness in                 the chest and backabout 2 weeks.  Sonographer:    Leavy Cella RDCS Referring Phys: [id number] [place]  1. Left ventricular ejection fraction, by estimation, is 40 to 45%. The left ventricle has mildly decreased function. The left ventricle demonstrates global hypokinesis. Left ventricular diastolic parameters are indeterminate.  2. Right ventricular systolic function is normal. The right ventricular size is normal.  3. The mitral valve is normal in structure. Mild mitral valve regurgitation. No evidence of mitral stenosis.  4. Tricuspid valve regurgitation is moderate.  5. The aortic valve is tricuspid. Aortic valve regurgitation is not visualized. No aortic stenosis is  present.  6. The inferior vena cava is normal in size with greater than 50% respiratory variability, suggesting right atrial pressure of 3 mmHg. FINDINGS  Left Ventricle: Left  ventricular ejection fraction, by estimation, is 40 to 45%. The left ventricle has mildly decreased function. The left ventricle demonstrates global hypokinesis. The left ventricular internal cavity size was normal in size. There is  no left ventricular hypertrophy. Left ventricular diastolic parameters are indeterminate. Right Ventricle: The right ventricular size is normal. No increase in right ventricular wall thickness. Right ventricular systolic function is normal. Left Atrium: Left atrial size was normal in size. Right Atrium: Right atrial size was normal in size. Pericardium: There is no evidence of pericardial effusion. Mitral Valve: The mitral valve is normal in structure. Mild mitral valve regurgitation. No evidence of mitral valve stenosis. Tricuspid Valve: The tricuspid valve is normal in structure. Tricuspid valve regurgitation is moderate . No evidence of tricuspid stenosis. Aortic Valve: The aortic valve is tricuspid. Aortic valve regurgitation is not visualized. No aortic stenosis is present. Pulmonic Valve: The pulmonic valve was normal in structure. Pulmonic valve regurgitation is not visualized. No evidence of pulmonic stenosis. Aorta: The aortic root is normal in size and structure. Venous: The inferior vena cava is normal in size with greater than 50% respiratory variability, suggesting right atrial pressure of 3 mmHg. IAS/Shunts: No atrial level shunt detected by color flow Doppler.  LEFT VENTRICLE PLAX 2D LVIDd:         3.93 cm   Diastology LVIDs:         2.14 cm   LV e' medial:    12.00 cm/s LV PW:         0.92 cm   LV E/e' medial:  5.3 LV IVS:        0.64 cm   LV e' lateral:   10.70 cm/s LVOT diam:     1.90 cm   LV E/e' lateral: 5.9 LV SV:         44 LV SV Index:   28 LVOT Area:     2.84 cm                            3D Volume EF:                          LV EDV:       66 ml                          LV ESV:       40 ml                          LV SV:        26 ml RIGHT VENTRICLE RV Basal diam:  3.41 cm RV Mid diam:    2.55 cm RV S prime:     12.40 cm/s TAPSE (M-mode): 2.4 cm LEFT ATRIUM             Index        RIGHT ATRIUM           Index LA diam:        2.60 cm 1.68 cm/m   RA Area:     14.60 cm LA Vol (A2C):   41.4 ml 26.81 ml/m  RA Volume:   36.80 ml  23.83 ml/m LA Vol (A4C):   21.6 ml 13.99 ml/m LA Biplane Vol: 32.2 ml 20.85 ml/m  AORTIC VALVE LVOT Vmax:   85.80 cm/s LVOT Vmean:  53.400  cm/s LVOT VTI:    0.155 m  AORTA Ao Root diam: 2.90 cm Ao Asc diam:  3.10 cm MITRAL VALVE               TRICUSPID VALVE MV Area (PHT): 4.57 cm    TR Peak grad:   17.5 mmHg MV Decel Time: 166 msec    TR Vmax:        209.00 cm/s MV E velocity: 63.20 cm/s MV A velocity: 51.70 cm/s  SHUNTS MV E/A ratio:  1.22        Systemic VTI:  0.16 m                            Systemic Diam: 1.90 cm Kardie Tobb DO Electronically signed by Berniece Salines DO Signature Date/Time: 03/10/2022/3:44:19 PM    Final     Procedures Procedures    Medications Ordered in ED Medications  sodium chloride 0.9 % bolus 1,000 mL (0 mLs Intravenous Stopped 03/11/22 2100)  ondansetron (ZOFRAN) injection 4 mg (4 mg Intravenous Given 03/11/22 2020)    ED Course/ Medical Decision Making/ A&P                           Medical Decision Making Amount and/or Complexity of Data Reviewed Labs: ordered. Radiology: ordered.  Risk Prescription drug management.   This patient presents to the ED with chief complaint(s) of chest pain, dyspnea,  with pertinent past medical history of chest pain of uncertain etiology, PVCs, hyperlipidemia.The complaint involves an extensive differential diagnosis and also carries with it a high risk of complications and morbidity.    The differential diagnosis includes ACS, angina pectoris, atypical chest pain, dysrhythmia.  Low  suspicion of PE, pneumothorax due to duration of patient's symptoms and having previously been evaluated for same.  The initial plan is to obtain baseline labs including CBC, BMP, troponin, and UA.  We will also get EKG and chest x-ray.  Additional history obtained: Additional history obtained from spouse Records reviewed  cardiology records  Initial Assessment:   On exam, patient is ill-appearing but nontoxic, in no acute distress.  Heart rate is irregular, sinus rhythm on monitor with PVCs, occasional bigeminy and trigeminy.  Heart sounds are normal, no appreciable murmurs.  Lung sounds are clear to auscultation bilaterally, air entry is normal.  Abdomen is soft and nontender.  Skin is warm and dry with normal cap refill.  Independent ECG/labs interpretation:  The following labs were independently interpreted:  ECG demonstrates sinus rhythm with occasional PVCs, no prolonged QT interval, no evidence of ischemia or infarction. CBC negative for anemia or leukocytosis.  BMP demonstrates no electrolyte disturbance.  Troponin was negative.  UA was colorless, low specific gravity, negative for protein.  Patient appears to have good hydration status despite reporting less fluid intake today.  Independent visualization and interpretation of imaging: Chest x-ray shows heart of normal size, no evidence of pneumothorax, pneumonia, or pleural effusions.  I agree with the radiologist interpretation.  Treatment and Reassessment: Patient states she experiences near syncopal episodes and tachycardia upon standing, which is suspicious for orthostatic hypotension.  Patient did have positive orthostatic in cardiology office with a 30 point increase in heart rate.  Will give patient fluid bolus and treat nausea with IV Zofran.  Patient states she feels worse during IV fluids, they were discontinued.  She states she began to have worsening shortness of breath  and feeling like water was going to her lungs and  heart.  Reassessment of lung sounds and cardiac exam revealed no changes.  Lung sounds are clear to auscultation bilaterally with good air movement.  Her SPO2 is 99 to 100%.  I do not feel repeating chest x-ray is beneficial at this time.  She is mildly tachycardic, but appears anxious.  Patient was able to ambulate to the bathroom without incident, did not experience syncopal episode.  Disposition:   Patient has been dealing with these symptoms chronically for the past month, is currently followed by cardiology and sees Dr. Oswaldo Milian.  She has had echo performed and has worn cardiac monitor for same symptoms.  She has CTA scheduled tomorrow, but came to ER due to feeling like she could no longer tolerate her symptoms.  Her laboratory work-up is reassuring and her troponin is negative.  She has remained in sinus rhythm with occasional PVCs while on the cardiac monitor.  She was able to ambulate to the bathroom and did not experience syncopal episode.  Discussed supportive care measures for patient's symptoms at home and to maintain good hydration.  Advised patient to continue following up with cardiology and to have her CTA done tomorrow.  Return precautions were given.  Discussed HPI, physical exam findings, assessment and plan with attending Cindee Lame who agrees with current plan and discharge of patient home.        Final Clinical Impression(s) / ED Diagnoses Final diagnoses:  Shortness of breath  Palpitations  Chest pain of uncertain etiology    Rx / DC Orders ED Discharge Orders     None         Pat Kocher, [place] 03/11/22 2338    Audley Hose, MD 03/17/22 [phone]

## 2022-03-11 NOTE — ED Triage Notes (Signed)
Patient here POV from Home.  Endorses Multiple Complaints including SOB, CP, Dizziness for approximately 1 Month. Pain is Intermittent but endorses Pressure is mostly constant.   Uncomfortable during Triage. A&Ox4. GCS 15. Ambulatory.

## 2022-03-11 NOTE — Discharge Instructions (Addendum)
Thank you for allowing me to be a part of your care today.  You were evaluated in the ER for shortness of breath and chest pain.  Your laboratory work-up is reassuring and your chest x-ray was normal.    Please continue to follow-up with Dr. Bjorn Pippin and have your CTA done tomorrow.  Return to the ER if you develop new or worsening symptoms.

## 2022-03-11 NOTE — ED Notes (Signed)
Pt states she is feeling worse after receiving IVF. Continues to feel nauseous.  VSS, lungs are clear HR is up slightly with occasional PVCs.  Pt does appear anxious, EDP made aware

## 2022-03-11 NOTE — Telephone Encounter (Signed)
Reaching out to patient to offer assistance regarding upcoming cardiac imaging study; pt verbalizes understanding of appt date/time, parking situation and where to check in, pre-test NPO status and medications ordered, and verified current allergies; name and call back number provided for further questions should they arise Rockwell Alexandria RN Navigator Cardiac Imaging [facility] [phone] office [phone] cell  Denies iv issues 50mg  metoprolol tartrate

## 2022-03-11 NOTE — Telephone Encounter (Signed)
Called pt to discuss Echo results. Pt states that she is unable to sleep at night d/t her elevated HR and breathing. Pt reports that her oura ring she is having breathing disturbances through out the night. She state that she unable to to get a good breath in. Her pulse ox shows a heart rate of 96 and O2 of 94. Pt states that she is very uncomfortable.  Please advise.

## 2022-03-12 ENCOUNTER — Ambulatory Visit (HOSPITAL_COMMUNITY)
Admission: RE | Admit: 2022-03-12 | Discharge: 2022-03-12 | Disposition: A | Discharge status: Home / Self Care | Payer: BC Managed Care – PPO | Source: Ambulatory Visit | Attending: Cardiology | Admitting: Cardiology

## 2022-03-12 DIAGNOSIS — R079 Chest pain, unspecified: Secondary | ICD-10-CM | POA: Diagnosis present

## 2022-03-12 DIAGNOSIS — R0609 Other forms of dyspnea: Secondary | ICD-10-CM | POA: Diagnosis present

## 2022-03-12 MED ORDER — NITROGLYCERIN 0.4 MG SL SUBL
0.8000 mg | SUBLINGUAL_TABLET | Freq: Once | SUBLINGUAL | Status: AC
  Administered 2022-03-12: 0.8 mg via SUBLINGUAL

## 2022-03-12 MED ORDER — NITROGLYCERIN 0.4 MG SL SUBL
SUBLINGUAL_TABLET | SUBLINGUAL | Status: AC
  Filled 2022-03-12: qty 2

## 2022-03-12 MED ORDER — IOHEXOL 350 MG/ML SOLN
95.0000 mL | Freq: Once | INTRAVENOUS | Status: AC | PRN
  Administered 2022-03-12: 95 mL via INTRAVENOUS

## 2022-03-12 MED ORDER — METOPROLOL TARTRATE 5 MG/5ML IV SOLN: 5.0000 mg | INTRAVENOUS | Status: DC | PRN

## 2022-03-12 MED ORDER — METOPROLOL TARTRATE 5 MG/5ML IV SOLN
INTRAVENOUS | Status: AC
  Filled 2022-03-12: qty 10

## 2022-03-13 ENCOUNTER — Encounter: Payer: Self-pay | Admitting: Cardiology

## 2022-03-13 ENCOUNTER — Telehealth: Payer: Self-pay | Admitting: Cardiology

## 2022-03-13 ENCOUNTER — Ambulatory Visit: Payer: BC Managed Care – PPO | Attending: Cardiology | Admitting: Cardiology

## 2022-03-13 VITALS — BP 131/79 | HR 87 | Ht 63.5 in | Wt 114.6 lb

## 2022-03-13 DIAGNOSIS — R0602 Shortness of breath: Secondary | ICD-10-CM | POA: Diagnosis not present

## 2022-03-13 DIAGNOSIS — E785 Hyperlipidemia, unspecified: Secondary | ICD-10-CM | POA: Diagnosis not present

## 2022-03-13 DIAGNOSIS — R079 Chest pain, unspecified: Secondary | ICD-10-CM

## 2022-03-13 DIAGNOSIS — I493 Ventricular premature depolarization: Secondary | ICD-10-CM

## 2022-03-13 MED ORDER — METOPROLOL SUCCINATE ER 25 MG PO TB24: 25.0000 mg | ORAL_TABLET | Freq: Every day | ORAL | 3 refills | Status: DC

## 2022-03-13 NOTE — Progress Notes (Unsigned)
Cardiology Office Note:    Date:  03/15/2022   ID:  Carol Morrison, DOB 14-May-1965, MRN [id number]  PCP:  Carol Limbo, MD  Cardiologist:  None  Electrophysiologist:  None   Referring MD: Carol Limbo, MD   Chief Complaint  Patient presents with   Shortness of Breath    History of Present Illness:    Carol Morrison is a 56 y.o. female with no significant past medical history who presents for follow-up.  She was seen in the ED for palpitations and chest pain on 02/06/2022.  Work-up including troponins and D-dimer were negative.  She reports she has been having tightness in the chest and back.  Started about 2 weeks before her ED visit.  Reports feels like pressure in center to left chest.  Typically short duration but the day she went to the ED had been lasting longer.  Also reports she gets short of breath.  States that she walks 5-10,000 steps per day, gets short of breath with exertion but has not noted chest pain.  She is also been having palpitations where feels like heart is fluttering.  Lasts short duration.  She monitors her heart rate, has noted it can go up to 130s at rest.  Notes that symptoms are worse if she drinks tea.  She typically does not have any caffeine intake and almost no alcohol intake.  Never smoked.  Family history includes father had aortic aneurysm, mother has pacemaker, and paternal grandfather died of MI.  Echocardiogram 20-Mar-2022 read as EF 40 to 45%, indeterminant diastolic function, normal RV function, moderate TR (by my review of echo appears normal EF, normal diastolic function, mild TR).  Coronary CTA on 03/12/2022 showed normal coronary arteries (calcium score 0).  PFTs on 03/09/2022 were normal.  Zio patch x7 days on 03/20/2022 showed 3 episodes of SVT, longest lasting 7 beats, frequent PVCs (6.7% of beats).  Since last clinic visit, she reports she continues to feel lightheaded.  Also having issues with loss of appetite and feels jittery.  Particular  feels lightheaded when she stands.  States that she has lost 5 pounds.  No past medical history on file.  Past Surgical History:  Procedure Laterality Date   AUGMENTATION MAMMAPLASTY Bilateral    FOOT SURGERY      Current Medications: Current Meds  Medication Sig   ascorbic acid (VITAMIN C) 500 MG tablet Take 1 tablet by mouth daily.   Cholecalciferol 50 MCG (2000 UT) CAPS Take by mouth.   cyanocobalamin (VITAMIN B12) 1000 MCG tablet Take by mouth.   metoprolol succinate (TOPROL XL) 25 MG 24 hr tablet Take 1 tablet (25 mg total) by mouth daily.   valACYclovir (VALTREX) 500 MG tablet Take 500 mg by mouth daily.   [DISCONTINUED] metoprolol tartrate (LOPRESSOR) 50 MG tablet Take 50 mg (1 tablet) TWO hours prior to CT scan     Allergies:   Patient has no known allergies.   Social History   Socioeconomic History   Marital status: Married    Spouse name: Not on file   Number of children: Not on file   Years of education: Not on file   Highest education level: Not on file  Occupational History   Not on file  Tobacco Use   Smoking status: Never   Smokeless tobacco: Never  Substance and Sexual Activity   Alcohol use: No   Drug use: No   Sexual activity: Not on file  Other Topics Concern   Not on file  Social History Narrative   Not on file   Social Determinants of Health   Financial Resource Strain: Not on file  Food Insecurity: Not on file  Transportation Needs: Not on file  Physical Activity: Not on file  Stress: Not on file  Social Connections: Not on file     Family History: The patient's family history includes Breast cancer in her cousin, cousin, maternal aunt, and maternal aunt.  ROS:   Please see the history of present illness.     All other systems reviewed and are negative.  EKGs/Labs/Other Studies Reviewed:    The following studies were reviewed today:   EKG:   02/25/2022: Sinus rhythm, rate 84, PACs/PVCs  Recent Labs: 03/06/2022: BNP 19.0;  Magnesium 2.2 03/11/2022: BUN 10; Creatinine, Ser 0.61; Hemoglobin 12.3; Platelets 234; Potassium 3.6; Sodium 137  Recent Lipid Panel No results found for: "CHOL", "TRIG", "HDL", "CHOLHDL", "VLDL", "LDLCALC", "LDLDIRECT"  Physical Exam:    VS:  BP 131/79   Pulse 87   Ht 5' 3.5" (1.613 m)   Wt 114 lb 9.6 oz (52 kg)   SpO2 100%   BMI 19.98 kg/m     Wt Readings from Last 3 Encounters:  03/13/22 114 lb 9.6 oz (52 kg)  03/11/22 116 lb 2.9 oz (52.7 kg)  03/06/22 116 lb 3.2 oz (52.7 kg)     GEN:  Well nourished, well developed in no acute distress HEENT: Normal NECK: No JVD; No carotid bruits LYMPHATICS: No lymphadenopathy CARDIAC: Irregular, normal rate, no murmurs, rubs, gallops RESPIRATORY:  Clear to auscultation without rales, wheezing or rhonchi  ABDOMEN: Soft, non-tender, non-distended MUSCULOSKELETAL:  No edema; No deformity  SKIN: Warm and dry NEUROLOGIC:  Alert and oriented x 3 PSYCHIATRIC:  Normal affect   ASSESSMENT:    1. PVC's (premature ventricular contractions)   2. Chest pain of uncertain etiology   3. Shortness of breath   4. Hyperlipidemia, unspecified hyperlipidemia type      PLAN:    Chest pain/DOE: Chest pain is atypical in description but also having dyspnea on exertion that could represent anginal equivalent.  Echocardiogram 03/10/2022 read as EF 40 to 45%, indeterminant diastolic function, normal RV function, moderate TR (by my review of echo appears normal EF, normal diastolic function, mild TR).  Coronary CTA on 03/12/2022 showed normal coronary arteries (calcium score 0).  PFTs on 03/09/2022 were normal.    Lightheadedness: Orthostatics in clinic show no drop in blood pressure with standing but did have 30 bpm increase in heart rate.  Could represent POTS.  Discussed conservative treatments for POTS including staying well-hydrated and use of compression stockings  Palpitations/PVCs: She reports having palpitations, EKG shows PVCs.  Zio patch x7 days  on 03/10/2022 showed 3 episodes of SVT, longest lasting 7 beats, frequent PVCs (6.7% of beats). -Start toprol XL 25 mg daily -Recommend cardiac MRI to evaluate for structural abnormality as cause of PVCs  Hyperlipidemia: LDL 147 on 02/09/22.  Calcium score 0 on 03/12/2022  RTC in 2 months   Medication Adjustments/Labs and Tests Ordered: Current medicines are reviewed at length with the patient today.  Concerns regarding medicines are outlined above.  Orders Placed This Encounter  Procedures   MR CARDIAC MORPHOLOGY W WO CONTRAST   Meds ordered this encounter  Medications   metoprolol succinate (TOPROL XL) 25 MG 24 hr tablet    Sig: Take 1 tablet (25 mg total) by mouth daily.    Dispense:  90 tablet    Refill:  3  Patient Instructions  Medication Instructions:  START metoprolol succinate (Toprol XL) 25 mg daily  *If you need a refill on your cardiac medications before your next appointment, please call your pharmacy*  Testing/Procedures: Your physician has requested that you have a cardiac MRI. Cardiac MRI uses a computer to create images of your heart as its beating, producing both still and moving pictures of your heart and major blood vessels. For further information please visit [email or web address] Please follow the instruction sheet given to you today for more information.  Follow-Up: At [facility], you and your health needs are our priority.  As part of our continuing mission to provide you with exceptional heart care, we have created designated Provider Care Teams.  These Care Teams include your primary Cardiologist (physician) and Advanced Practice Providers (APPs -  Physician Assistants and Nurse Practitioners) who all work together to provide you with the care you need, when you need it.  We recommend signing up for the patient portal called "MyChart".  Sign up information is provided on this After Visit Summary.  MyChart is used to connect with patients for  Virtual Visits (Telemedicine).  Patients are able to view lab/test results, encounter notes, upcoming appointments, etc.  Non-urgent messages can be sent to your provider as well.   To learn more about what you can do with MyChart, go to [email or web address].    Your next appointment:   As scheduled with Dr. Gardiner Rhyme  Other Instructions   You are scheduled for Cardiac MRI on ______________. Please arrive for your appointment at ______________ ( arrive 30-45 minutes prior to test start time). ?  [facility] [address] [place], [place] [address] [phone] Please take advantage of the free valet parking available at the MAIN entrance (A entrance).  Proceed to the [facility] Radiology Department (First Floor) for check-in.   [facility] [place], [place] [address] [phone] Please take advantage of the free valet parking available at the MAIN entrance. Proceed to [facility] registration for check-in (first floor).  Magnetic resonance imaging (MRI) is a painless test that produces images of the inside of the body without using Xrays.  During an MRI, strong magnets and radio waves work together in a Research officer, political party to form detailed images.   MRI images may provide more details about a medical condition than X-rays, CT scans, and ultrasounds can provide.  You may be given earphones to listen for instructions.  You may eat a light breakfast and take medications as ordered with the exception of HCTZ (fluid pill, other). Please avoid stimulants for 12 hr prior to test. (Ie. Caffeine, nicotine, chocolate, or antihistamine medications)  If a contrast material will be used, an IV will be inserted into one of your veins. Contrast material will be injected into your IV. It will leave your body through your urine within a day. You may be told to drink plenty of fluids to help flush the contrast material out of your system.  You  will be asked to remove all metal, including: Watch, jewelry, and other metal objects including hearing aids, hair pieces and dentures. Also wearable glucose monitoring systems (ie. Freestyle Libre and Omnipods) (Braces and fillings normally are not a problem.)   TEST WILL TAKE APPROXIMATELY 1 HOUR  PLEASE NOTIFY SCHEDULING AT LEAST 24 HOURS IN ADVANCE IF YOU ARE UNABLE TO KEEP YOUR APPOINTMENT. [phone]  Please call Marchia Bond, cardiac imaging nurse navigator with any questions/concerns.  Rockwell Alexandria RN Navigator Cardiac Imaging Larey Brick RN Navigator Cardiac Imaging [facility] Heart and Vascular Services [phone] Office           Signed, Little Ishikawa, MD  03/15/2022 2:39 PM    [facility]

## 2022-03-13 NOTE — Telephone Encounter (Signed)
Spoke to patient she stated she took her first dose of Metoprolol Succ 25 mg this afternoon.Stated about 30 mins after taking she had palpitations.Advised try and take daily over the weekend and call back on Monday if palpitations continue.I will make Dr.Schumann aware.

## 2022-03-13 NOTE — Patient Instructions (Signed)
Medication Instructions:  START metoprolol succinate (Toprol XL) 25 mg daily  *If you need a refill on your cardiac medications before your next appointment, please call your pharmacy*  Testing/Procedures: Your physician has requested that you have a cardiac MRI. Cardiac MRI uses a computer to create images of your heart as its beating, producing both still and moving pictures of your heart and major blood vessels. For further information please visit [email or web address]. Please follow the instruction sheet given to you today for more information.  Follow-Up: At [facility], you and your health needs are our priority.  As part of our continuing mission to provide you with exceptional heart care, we have created designated Provider Care Teams.  These Care Teams include your primary Cardiologist (physician) and Advanced Practice Providers (APPs -  Physician Assistants and Nurse Practitioners) who all work together to provide you with the care you need, when you need it.  We recommend signing up for the patient portal called "MyChart".  Sign up information is provided on this After Visit Summary.  MyChart is used to connect with patients for Virtual Visits (Telemedicine).  Patients are able to view lab/test results, encounter notes, upcoming appointments, etc.  Non-urgent messages can be sent to your provider as well.   To learn more about what you can do with MyChart, go to [email or web address].    Your next appointment:   As scheduled with Dr. Bjorn Pippin  Other Instructions   You are scheduled for Cardiac MRI on ______________. Please arrive for your appointment at ______________ ( arrive 30-45 minutes prior to test start time). ?  [facility] [address] [place], [place] [address] [phone] Please take advantage of the free valet parking available at the MAIN entrance (A entrance).  Proceed to the [facility] Radiology Department (First Floor) for check-in.    OR   [facility] [address] [place], [place] [address] [phone] Please take advantage of the free valet parking available at the MAIN entrance. Proceed to [facility] registration for check-in (first floor).  Magnetic resonance imaging (MRI) is a painless test that produces images of the inside of the body without using Xrays.  During an MRI, strong magnets and radio waves work together in a Data processing manager to form detailed images.   MRI images may provide more details about a medical condition than X-rays, CT scans, and ultrasounds can provide.  You may be given earphones to listen for instructions.  You may eat a light breakfast and take medications as ordered with the exception of HCTZ (fluid pill, other). Please avoid stimulants for 12 hr prior to test. (Ie. Caffeine, nicotine, chocolate, or antihistamine medications)  If a contrast material will be used, an IV will be inserted into one of your veins. Contrast material will be injected into your IV. It will leave your body through your urine within a day. You may be told to drink plenty of fluids to help flush the contrast material out of your system.  You will be asked to remove all metal, including: Watch, jewelry, and other metal objects including hearing aids, hair pieces and dentures. Also wearable glucose monitoring systems (ie. Freestyle Libre and Omnipods) (Braces and fillings normally are not a problem.)   TEST WILL TAKE APPROXIMATELY 1 HOUR  PLEASE NOTIFY SCHEDULING AT LEAST 24 HOURS IN ADVANCE IF YOU ARE UNABLE TO KEEP YOUR APPOINTMENT. [phone]  Please call Rockwell Alexandria, cardiac imaging nurse navigator with any questions/concerns. Rockwell Alexandria RN Navigator  Cardiac Imaging Larey Brick RN Navigator Cardiac Imaging [facility] Heart and Vascular Services [phone] Office

## 2022-03-13 NOTE — Telephone Encounter (Signed)
Pt c/o medication issue:  1. Name of Medication: metoprolol succinate (TOPROL XL) 25 MG 24 hr tablet   2. How are you currently taking this medication (dosage and times per day)? Take 1 tablet (25 mg total) by mouth daily.   3. Are you having a reaction (difficulty breathing--STAT)?   4. What is your medication issue? Pt states she just took her first dose of metoprolol today and she had some heart flutters that felt like "a baby kicking in her heart". Pt is concerned if this is normal or not,  Please advise

## 2022-03-15 NOTE — Telephone Encounter (Signed)
Agree with plan 

## 2022-03-16 ENCOUNTER — Telehealth: Payer: Self-pay | Admitting: Cardiology

## 2022-03-16 MED ORDER — METOPROLOL SUCCINATE ER 25 MG PO TB24: 12.5000 mg | ORAL_TABLET | Freq: Every day | ORAL | 3 refills | Status: DC

## 2022-03-16 NOTE — Telephone Encounter (Signed)
Spoke to patient she stated she cannot take Metoprolol Succ 25 mg.Stated made palpitations worse and she did not feel good after taking.Advised I will send message to Dr.Schumann for advice.

## 2022-03-16 NOTE — Telephone Encounter (Signed)
Spoke to patient-made aware of MD recommendations.  Patient is willing to try 12.5 mg daily.   She will let us know if she is unable to tolerate this.     Patient states when she is sleeping her O2 is dropping 90-92 and her HR increases.  She denies snoring, reports waking up multiple times a week gasping for air (like she has been underwater), reports daytime fatigue.  She is requesting a sleep study.   STOP Bang 3.    Patient also reports [facility] is scheduled for March.  She is hoping this can be completed sooner.  She states MRI scheduler was checking availability at [facility] as well.   Patient also reports buying an omron upper arm BP cuff as recommended.  She believes the cuff is too large for her.   Advised to take to her appt next week with pcp to see if reading accurate, if reading low may need smaller cuff.

## 2022-03-16 NOTE — Addendum Note (Signed)
Addended by: Johney Frame A on: 03/16/2022 05:40 PM   Modules accepted: Orders

## 2022-03-16 NOTE — Telephone Encounter (Signed)
Recommend trying 12.5 mg daily

## 2022-03-16 NOTE — Telephone Encounter (Signed)
Pt c/o medication issue:  1. Name of Medication: metoprolol succinate (TOPROL XL) 25 MG 24 hr tablet   2. How are you currently taking this medication (dosage and times per day)? Stopped taking   3. Are you having a reaction (difficulty breathing--STAT)? Yes  4. What is your medication issue? Pt states that this medication was has raised the fluttering feeling and has overall not made her feel well. Requesting call back.

## 2022-03-16 NOTE — Telephone Encounter (Signed)
That is fine, we can do Itamar sleep study

## 2022-04-07 ENCOUNTER — Telehealth (HOSPITAL_COMMUNITY): Payer: Self-pay | Admitting: *Deleted

## 2022-04-07 NOTE — Telephone Encounter (Signed)
Attempted to call patient regarding upcoming cardiac MRI appointment. Left message on voicemail with name and callback number  Donnetta Gillin RN Navigator Cardiac Imaging [place] [phone] Office [phone] Cell  

## 2022-04-08 ENCOUNTER — Other Ambulatory Visit: Payer: Self-pay | Admitting: Cardiology

## 2022-04-08 ENCOUNTER — Ambulatory Visit
Admission: RE | Admit: 2022-04-08 | Discharge: 2022-04-08 | Disposition: A | Discharge status: Home / Self Care | Payer: BC Managed Care – PPO | Source: Ambulatory Visit | Attending: Cardiology | Admitting: Cardiology

## 2022-04-08 DIAGNOSIS — I493 Ventricular premature depolarization: Secondary | ICD-10-CM | POA: Insufficient documentation

## 2022-04-08 MED ORDER — GADOBUTROL 1 MMOL/ML IV SOLN
8.0000 mL | Freq: Once | INTRAVENOUS | Status: AC | PRN
  Administered 2022-04-08: 8 mL via INTRAVENOUS

## 2022-06-02 NOTE — Progress Notes (Unsigned)
Cardiology Office Note:    Date:  06/04/2022   ID:  Carol Morrison, DOB 1965-11-17, MRN [id number]  PCP:  Bernerd Limbo, MD  Cardiologist:  None  Electrophysiologist:  None   Referring MD: Bernerd Limbo, MD   Chief Complaint  Patient presents with   Palpitations    History of Present Illness:    Carol Morrison is a 57 y.o. female with no significant past medical history who presents for follow-up.  She was seen in the ED for palpitations and chest pain on 02/06/2022.  Work-up including troponins and D-dimer were negative.  She reports she has been having tightness in the chest and back.  Started about 2 weeks before her ED visit.  Reports feels like pressure in center to left chest.  Typically short duration but the day she went to the ED had been lasting longer.  Also reports she gets short of breath.  States that she walks 5-10,000 steps per day, gets short of breath with exertion but has not noted chest pain.  She is also been having palpitations where feels like heart is fluttering.  Lasts short duration.  She monitors her heart rate, has noted it can go up to 130s at rest.  Notes that symptoms are worse if she drinks tea.  She typically does not have any caffeine intake and almost no alcohol intake.  Never smoked.  Family history includes father had aortic aneurysm, mother has pacemaker, and paternal grandfather died of MI.  Echocardiogram 2022-03-30 read as EF 40 to 45%, indeterminant diastolic function, normal RV function, moderate TR (by my review of echo appears normal EF, normal diastolic function, mild TR).  Coronary CTA on 03/12/2022 showed normal coronary arteries (calcium score 0).  PFTs on 03/09/2022 were normal.  Zio patch x7 days on 2022-03-30 showed 3 episodes of SVT, longest lasting 7 beats, frequent PVCs (6.7% of beats).  Cardiac MRI on 04/08/2022 showed LVEF 64%, no LGE, RVEF 60%, mild TR.  Since last clinic visit, she reports that she is doing much better.  Reports  dyspnea, lightheadedness, and palpitations have improved.  She did not tolerate Toprol-XL 25 mg daily and decreased dose to 12.5 mg but did not tolerate either.  She denies any chest pain, syncope, or lower extremity edema.  Can walk up flight of stairs without stopping.   No past medical history on file.  Past Surgical History:  Procedure Laterality Date   AUGMENTATION MAMMAPLASTY Bilateral    FOOT SURGERY      Current Medications: Current Meds  Medication Sig   ascorbic acid (VITAMIN C) 500 MG tablet Take 1 tablet by mouth daily.   Cholecalciferol 50 MCG (2000 UT) CAPS Take 2,000 Units by mouth daily in the afternoon.   cyanocobalamin (VITAMIN B12) 1000 MCG tablet Take 1,000 mcg by mouth daily.   metoprolol tartrate (LOPRESSOR) 25 MG tablet Take 0.5 tablets (12.5 mg total) by mouth 2 (two) times daily as needed.   valACYclovir (VALTREX) 500 MG tablet Take 500 mg by mouth daily.     Allergies:   Patient has no known allergies.   Social History   Socioeconomic History   Marital status: Married    Spouse name: Not on file   Number of children: Not on file   Years of education: Not on file   Highest education level: Not on file  Occupational History   Not on file  Tobacco Use   Smoking status: Never   Smokeless tobacco: Never  Substance and Sexual  Activity   Alcohol use: No   Drug use: No   Sexual activity: Not on file  Other Topics Concern   Not on file  Social History Narrative   Not on file   Social Determinants of Health   Financial Resource Strain: Not on file  Food Insecurity: Not on file  Transportation Needs: Not on file  Physical Activity: Not on file  Stress: Not on file  Social Connections: Not on file     Family History: The patient's family history includes Breast cancer in her cousin, cousin, maternal aunt, and maternal aunt.  ROS:   Please see the history of present illness.     All other systems reviewed and are negative.  EKGs/Labs/Other  Studies Reviewed:    The following studies were reviewed today:   EKG:   02/25/2022: Sinus rhythm, rate 84, PACs/PVCs 06/04/22: Sinus rhythm with PVCs, rate 72  Recent Labs: 03/06/2022: BNP 19.0; Magnesium 2.2 03/11/2022: BUN 10; Creatinine, Ser 0.61; Hemoglobin 12.3; Platelets 234; Potassium 3.6; Sodium 137  Recent Lipid Panel No results found for: "CHOL", "TRIG", "HDL", "CHOLHDL", "VLDL", "LDLCALC", "LDLDIRECT"  Physical Exam:    VS:  BP 96/64 (BP Location: Left Arm, Patient Position: Sitting, Cuff Size: Normal)   Pulse 72   Ht 5\' 3"  (1.6 m)   Wt 118 lb (53.5 kg)   SpO2 99%   BMI 20.90 kg/m     Wt Readings from Last 3 Encounters:  06/04/22 118 lb (53.5 kg)  03/13/22 114 lb 9.6 oz (52 kg)  03/11/22 116 lb 2.9 oz (52.7 kg)     GEN:  Well nourished, well developed in no acute distress HEENT: Normal NECK: No JVD; No carotid bruits LYMPHATICS: No lymphadenopathy CARDIAC: Irregular, normal rate, no murmurs, rubs, gallops RESPIRATORY:  Clear to auscultation without rales, wheezing or rhonchi  ABDOMEN: Soft, non-tender, non-distended MUSCULOSKELETAL:  No edema; No deformity  SKIN: Warm and dry NEUROLOGIC:  Alert and oriented x 3 PSYCHIATRIC:  Normal affect   ASSESSMENT:    1. PVC's (premature ventricular contractions)   2. Palpitations   3. Chest pain of uncertain etiology   4. DOE (dyspnea on exertion)   5. Pre-op evaluation      PLAN:    Chest pain/DOE: Chest pain is atypical in description but also having dyspnea on exertion that could represent anginal equivalent.  Echocardiogram 03/10/2022 read as EF 40 to 45%, indeterminant diastolic function, normal RV function, moderate TR (by my review of echo appears normal EF, normal diastolic function, mild TR).  Coronary CTA on 03/12/2022 showed normal coronary arteries (calcium score 0).  PFTs on 03/09/2022 were normal.  Cardiac MRI on 04/08/2022 showed LVEF 64%, no LGE, RVEF 60%, mild TR. -Reports symptoms have improved,  no further workup recommended at this time  Lightheadedness: Orthostatics in clinic show no drop in blood pressure with standing but did have 30 bpm increase in heart rate.  Could represent POTS.  Discussed conservative treatments for POTS including staying well-hydrated and use of compression stockings -Reports improvement in symptoms  Palpitations/PVCs: She reports having palpitations, EKG shows PVCs.  Zio patch x7 days on 03/10/2022 showed 3 episodes of SVT, longest lasting 7 beats, frequent PVCs (6.7% of beats). -Started toprol XL 25 mg daily but did not tolerate, dose reduced to 12.5 mg daily but did not tolerate -Cardiac MRI on 04/08/2022 showed LVEF 64%, no LGE, RVEF 60%, mild TR. -Reports palpitations have improved, will monitor.  Will give Lopressor 12.5 mg as needed for  palpitations  Preop evaluation: Patient reports she is undergoing surgery to revise breast implants.  Normal coronary arteries on CTA and normal biventricular function on CMR as above.  No further cardiac workup recommended prior to her surgery  Hyperlipidemia: LDL 147 on 02/09/22.  Calcium score 0 on 03/12/2022  RTC in 6 months   Medication Adjustments/Labs and Tests Ordered: Current medicines are reviewed at length with the patient today.  Concerns regarding medicines are outlined above.  Orders Placed This Encounter  Procedures   EKG 12-Lead   Meds ordered this encounter  Medications   metoprolol tartrate (LOPRESSOR) 25 MG tablet    Sig: Take 0.5 tablets (12.5 mg total) by mouth 2 (two) times daily as needed.    Dispense:  30 tablet    Refill:  3    Stop Toprol XL    Patient Instructions  Medication Instructions:  STOP metoprolol succinate (Toprol XL) START metoprolol tartrate (Lopressor) 12.5 mg two times daily AS NEEDED   *If you need a refill on your cardiac medications before your next appointment, please call your pharmacy*  Follow-Up: At [facility], you and your health needs are  our priority.  As part of our continuing mission to provide you with exceptional heart care, we have created designated Provider Care Teams.  These Care Teams include your primary Cardiologist (physician) and Advanced Practice Providers (APPs -  Physician Assistants and Nurse Practitioners) who all work together to provide you with the care you need, when you need it.  We recommend signing up for the patient portal called "MyChart".  Sign up information is provided on this After Visit Summary.  MyChart is used to connect with patients for Virtual Visits (Telemedicine).  Patients are able to view lab/test results, encounter notes, upcoming appointments, etc.  Non-urgent messages can be sent to your provider as well.   To learn more about what you can do with MyChart, go to [email or web address].    Your next appointment:   6 month(s)  Provider:   Dr. Gardiner Rhyme    Signed, Donato Heinz, MD  06/04/2022 5:22 PM    [facility]

## 2022-06-04 ENCOUNTER — Encounter: Payer: Self-pay | Admitting: Cardiology

## 2022-06-04 ENCOUNTER — Ambulatory Visit: Payer: BC Managed Care – PPO | Attending: Cardiology | Admitting: Cardiology

## 2022-06-04 VITALS — BP 96/64 | HR 72 | Ht 63.0 in | Wt 118.0 lb

## 2022-06-04 DIAGNOSIS — Z01818 Encounter for other preprocedural examination: Secondary | ICD-10-CM

## 2022-06-04 DIAGNOSIS — R002 Palpitations: Secondary | ICD-10-CM

## 2022-06-04 DIAGNOSIS — R079 Chest pain, unspecified: Secondary | ICD-10-CM | POA: Diagnosis not present

## 2022-06-04 DIAGNOSIS — R0609 Other forms of dyspnea: Secondary | ICD-10-CM | POA: Diagnosis not present

## 2022-06-04 DIAGNOSIS — I493 Ventricular premature depolarization: Secondary | ICD-10-CM | POA: Diagnosis not present

## 2022-06-04 MED ORDER — METOPROLOL TARTRATE 25 MG PO TABS: 12.5000 mg | ORAL_TABLET | Freq: Two times a day (BID) | ORAL | 3 refills | Status: AC | PRN

## 2022-06-04 NOTE — Patient Instructions (Signed)
Medication Instructions:  STOP metoprolol succinate (Toprol XL) START metoprolol tartrate (Lopressor) 12.5 mg two times daily AS NEEDED   *If you need a refill on your cardiac medications before your next appointment, please call your pharmacy*  Follow-Up: At [facility], you and your health needs are our priority.  As part of our continuing mission to provide you with exceptional heart care, we have created designated Provider Care Teams.  These Care Teams include your primary Cardiologist (physician) and Advanced Practice Providers (APPs -  Physician Assistants and Nurse Practitioners) who all work together to provide you with the care you need, when you need it.  We recommend signing up for the patient portal called "MyChart".  Sign up information is provided on this After Visit Summary.  MyChart is used to connect with patients for Virtual Visits (Telemedicine).  Patients are able to view lab/test results, encounter notes, upcoming appointments, etc.  Non-urgent messages can be sent to your provider as well.   To learn more about what you can do with MyChart, go to [email or web address].    Your next appointment:   6 month(s)  Provider:   Dr. Gardiner Rhyme

## 2022-06-19 ENCOUNTER — Other Ambulatory Visit (HOSPITAL_COMMUNITY): Payer: BC Managed Care – PPO

## 2022-09-06 ENCOUNTER — Other Ambulatory Visit: Payer: Self-pay | Admitting: Cardiology

## 2022-12-17 ENCOUNTER — Ambulatory Visit: Payer: BC Managed Care – PPO | Admitting: Cardiology
